# Patient Record
Sex: Female | Born: 1975
Health system: Southern US, Community
[De-identification: ages and names within clinical notes are randomized; demographics above are authoritative.]

## PROBLEM LIST (undated history)

## (undated) DIAGNOSIS — I499 Cardiac arrhythmia, unspecified: Secondary | ICD-10-CM

## (undated) DIAGNOSIS — Z8619 Personal history of other infectious and parasitic diseases: Secondary | ICD-10-CM

## (undated) DIAGNOSIS — N809 Endometriosis, unspecified: Secondary | ICD-10-CM

## (undated) DIAGNOSIS — R011 Cardiac murmur, unspecified: Secondary | ICD-10-CM

## (undated) DIAGNOSIS — I10 Essential (primary) hypertension: Secondary | ICD-10-CM

## (undated) DIAGNOSIS — Z87440 Personal history of urinary (tract) infections: Secondary | ICD-10-CM

## (undated) HISTORY — DX: Personal history of other infectious and parasitic diseases: Z86.19

## (undated) HISTORY — PX: OVARIAN CYST REMOVAL: SHX89

## (undated) HISTORY — DX: Endometriosis, unspecified: N80.9

## (undated) HISTORY — DX: Personal history of urinary (tract) infections: Z87.440

## (undated) HISTORY — DX: Cardiac arrhythmia, unspecified: I49.9

## (undated) HISTORY — PX: TUBAL LIGATION: SHX77

## (undated) HISTORY — DX: Cardiac murmur, unspecified: R01.1

## (undated) HISTORY — DX: Essential (primary) hypertension: I10

---

## 2012-03-30 HISTORY — PX: OOPHORECTOMY: SHX86

## 2012-08-08 LAB — HM PAP SMEAR: HM Pap smear: NORMAL

## 2012-12-15 DIAGNOSIS — I10 Essential (primary) hypertension: Secondary | ICD-10-CM | POA: Insufficient documentation

## 2013-08-20 DIAGNOSIS — R002 Palpitations: Secondary | ICD-10-CM | POA: Insufficient documentation

## 2014-01-05 ENCOUNTER — Ambulatory Visit: Payer: Self-pay | Admitting: Family Medicine

## 2014-01-05 LAB — URINALYSIS, COMPLETE
BILIRUBIN, UR: NEGATIVE
Glucose,UR: NEGATIVE
Ketone: NEGATIVE
NITRITE: NEGATIVE
Ph: 7 (ref 5.0–8.0)
Protein: NEGATIVE
Specific Gravity: 1.01 (ref 1.000–1.030)

## 2014-01-07 LAB — URINE CULTURE

## 2014-02-08 ENCOUNTER — Encounter: Payer: Self-pay | Admitting: Internal Medicine

## 2014-02-08 ENCOUNTER — Other Ambulatory Visit (HOSPITAL_COMMUNITY)
Admission: RE | Admit: 2014-02-08 | Discharge: 2014-02-08 | Disposition: A | Discharge status: Home / Self Care | Payer: 59 | Source: Ambulatory Visit | Attending: Internal Medicine | Admitting: Internal Medicine

## 2014-02-08 ENCOUNTER — Ambulatory Visit (INDEPENDENT_AMBULATORY_CARE_PROVIDER_SITE_OTHER): Payer: 59 | Admitting: Internal Medicine

## 2014-02-08 VITALS — BP 138/82 | HR 83 | Temp 98.5°F | Wt 200.2 lb

## 2014-02-08 DIAGNOSIS — Z1151 Encounter for screening for human papillomavirus (HPV): Secondary | ICD-10-CM | POA: Diagnosis present

## 2014-02-08 DIAGNOSIS — I10 Essential (primary) hypertension: Secondary | ICD-10-CM

## 2014-02-08 DIAGNOSIS — Z01419 Encounter for gynecological examination (general) (routine) without abnormal findings: Secondary | ICD-10-CM | POA: Diagnosis present

## 2014-02-08 DIAGNOSIS — Z0000 Encounter for general adult medical examination without abnormal findings: Secondary | ICD-10-CM | POA: Insufficient documentation

## 2014-02-08 LAB — HM PAP SMEAR: HM Pap smear: NEGATIVE

## 2014-02-08 MED ORDER — ESOMEPRAZOLE MAGNESIUM 40 MG PO CPDR: 40.0000 mg | DELAYED_RELEASE_CAPSULE | Freq: Every day | ORAL | Status: DC

## 2014-02-08 MED ORDER — OLMESARTAN MEDOXOMIL 20 MG PO TABS: 20.0000 mg | ORAL_TABLET | Freq: Every day | ORAL | Status: DC

## 2014-02-08 MED ORDER — METOPROLOL SUCCINATE ER 25 MG PO TB24: 25.0000 mg | ORAL_TABLET | Freq: Every day | ORAL | Status: DC

## 2014-02-08 NOTE — Progress Notes (Signed)
Pre visit review using our clinic review tool, if applicable. No additional management support is needed unless otherwise documented below in the visit note. 

## 2014-02-08 NOTE — Assessment & Plan Note (Addendum)
BP Readings from Last 3 Encounters:  02/08/14 138/82   BP well controlled. Will check renal function with labs. Reviewed notes from [facility]. Continue Benicar and Metoprolol.

## 2014-02-08 NOTE — Addendum Note (Signed)
Addended by: Karlene Einstein D on: 02/08/2014 04:50 PM   Modules accepted: Orders

## 2014-02-08 NOTE — Patient Instructions (Signed)

## 2014-02-08 NOTE — Progress Notes (Signed)
Subjective:    Patient ID: Amanda Acosta, female    DOB: 01-Dec-1975, 38 y.o.   MRN: [id number]  HPI 38YO female presents to establish care and for physical exam.  Feeling well.  Followed at [facility] cardiology for hypertension and palpitations. Tolerating medications well. BP mostly <130/80 in the morning, but occasionally slightly higher in the evenings. No chest pain, headache, palpitations.  Last PAP 18 months ago was normal. Previous PAP HPV pos.   Review of Systems  Constitutional: Negative for fever, chills, appetite change, fatigue and unexpected weight change.  Eyes: Negative for visual disturbance.  Respiratory: Negative for shortness of breath.   Cardiovascular: Negative for chest pain and leg swelling.  Gastrointestinal: Negative for nausea, vomiting, abdominal pain, diarrhea and constipation.  Musculoskeletal: Negative for myalgias and arthralgias.  Skin: Negative for color change and rash.  Hematological: Negative for adenopathy. Does not bruise/bleed easily.  Psychiatric/Behavioral: Negative for dysphoric mood. The patient is not nervous/anxious.        Objective:    BP 138/82 mmHg  Pulse 83  Temp(Src) 98.5 F (36.9 C) (Oral)  Wt 200 lb 4 oz (90.833 kg)  SpO2 99%  LMP 01/27/2014 Physical Exam  Constitutional: She is oriented to person, place, and time. She appears well-developed and well-nourished. No distress.  HENT:  Head: Normocephalic and atraumatic.  Right Ear: External ear normal.  Left Ear: External ear normal.  Nose: Nose normal.  Mouth/Throat: Oropharynx is clear and moist. No oropharyngeal exudate.  Eyes: Conjunctivae are normal. Pupils are equal, round, and reactive to light. Right eye exhibits no discharge. Left eye exhibits no discharge. No scleral icterus.  Neck: Normal range of motion. Neck supple. No tracheal deviation present. No thyromegaly present.  Cardiovascular: Normal rate, regular rhythm, normal heart sounds and intact distal pulses.   Exam reveals no gallop and no friction rub.   No murmur heard. Pulmonary/Chest: Effort normal and breath sounds normal. No respiratory distress. She has no wheezes. She has no rales. She exhibits no tenderness.  Abdominal: Soft. Bowel sounds are normal. She exhibits no distension and no mass. There is no tenderness. There is no rebound and no guarding.  Genitourinary: Rectum normal, vagina normal and uterus normal. No breast swelling, tenderness, discharge or bleeding. Pelvic exam was performed with patient supine. There is no rash, tenderness or lesion on the right labia. There is no rash, tenderness or lesion on the left labia. Uterus is not enlarged and not tender. Cervix exhibits no motion tenderness, no discharge and no friability. Right adnexum displays no mass, no tenderness and no fullness. Left adnexum displays no mass, no tenderness and no fullness. No erythema or tenderness in the vagina. No vaginal discharge found.  Musculoskeletal: Normal range of motion. She exhibits no edema or tenderness.  Lymphadenopathy:    She has no cervical adenopathy.  Neurological: She is alert and oriented to person, place, and time. No cranial nerve deficit. She exhibits normal muscle tone. Coordination normal.  Skin: Skin is warm and dry. No rash noted. She is not diaphoretic. No erythema. No pallor.  Psychiatric: She has a normal mood and affect. Her behavior is normal. Judgment and thought content normal.          Assessment & Plan:   Problem List Items Addressed This Visit      Unprioritized   BP (high blood pressure)    BP Readings from Last 3 Encounters:  02/08/14 138/82   BP well controlled. Will check renal function with  labs. Reviewed notes from [facility]. Continue Benicar and Metoprolol.    Relevant Medications      metoprolol succinate (TOPROL-XL) 24 hr tablet      olmesartan (BENICAR) tablet   Routine general medical examination at a health care facility - Primary    General medical  exam normal today including breast and pelvic exam. PAP pending. Encouraged healthy diet and exercise. Will get fasting labs including CBC, CMP, lipids, TSH, A1c. Flu vaccine UTD.    Relevant Orders      CBC with Differential      Comprehensive metabolic panel      Lipid panel      Microalbumin / creatinine urine ratio      Vit D  25 hydroxy (rtn osteoporosis monitoring)      TSH      Hemoglobin A1c       Return in about 1 year (around 02/09/2015) for Physical.

## 2014-02-08 NOTE — Assessment & Plan Note (Signed)
General medical exam normal today including breast and pelvic exam. PAP pending. Encouraged healthy diet and exercise. Will get fasting labs including CBC, CMP, lipids, TSH, A1c. Flu vaccine UTD.

## 2014-02-14 ENCOUNTER — Telehealth: Payer: Self-pay | Admitting: Internal Medicine

## 2014-02-14 NOTE — Telephone Encounter (Signed)
emmi emailed °

## 2014-02-15 ENCOUNTER — Other Ambulatory Visit: Payer: Self-pay | Admitting: *Deleted

## 2014-02-15 ENCOUNTER — Encounter: Payer: Self-pay | Admitting: Internal Medicine

## 2014-02-15 DIAGNOSIS — Z0000 Encounter for general adult medical examination without abnormal findings: Secondary | ICD-10-CM

## 2014-02-15 LAB — CYTOLOGY - PAP

## 2014-02-16 ENCOUNTER — Other Ambulatory Visit (INDEPENDENT_AMBULATORY_CARE_PROVIDER_SITE_OTHER): Payer: 59

## 2014-02-16 DIAGNOSIS — Z0000 Encounter for general adult medical examination without abnormal findings: Secondary | ICD-10-CM

## 2014-02-16 LAB — LIPID PANEL
CHOL/HDL RATIO: 3
Cholesterol: 173 mg/dL (ref 0–200)
HDL: 52.9 mg/dL (ref >39.00)
LDL Cholesterol: 110 mg/dL — ABNORMAL HIGH (ref 0–99)
NonHDL: 120.1
Triglycerides: 53 mg/dL (ref 0.0–149.0)
VLDL: 10.6 mg/dL (ref 0.0–40.0)

## 2014-02-16 LAB — COMPREHENSIVE METABOLIC PANEL
ALT: 21 U/L (ref 0–35)
AST: 17 U/L (ref 0–37)
Albumin: 4.2 g/dL (ref 3.5–5.2)
Alkaline Phosphatase: 48 U/L (ref 39–117)
BILIRUBIN TOTAL: 0.8 mg/dL (ref 0.2–1.2)
BUN: 13 mg/dL (ref 6–23)
CO2: 25 mEq/L (ref 19–32)
CREATININE: 0.8 mg/dL (ref 0.4–1.2)
Calcium: 9.2 mg/dL (ref 8.4–10.5)
Chloride: 106 mEq/L (ref 96–112)
GFR: 87.14 mL/min (ref >60.00)
Glucose, Bld: 97 mg/dL (ref 70–99)
Potassium: 4.9 mEq/L (ref 3.5–5.1)
Sodium: 137 mEq/L (ref 135–145)
Total Protein: 6.7 g/dL (ref 6.0–8.3)

## 2014-02-16 LAB — CBC WITH DIFFERENTIAL/PLATELET
BASOS ABS: 0 10*3/uL (ref 0.0–0.1)
Basophils Relative: 0.3 % (ref 0.0–3.0)
EOS ABS: 0.1 10*3/uL (ref 0.0–0.7)
Eosinophils Relative: 1.2 % (ref 0.0–5.0)
HEMATOCRIT: 33.8 % — AB (ref 36.0–46.0)
HEMOGLOBIN: 11.2 g/dL — AB (ref 12.0–15.0)
LYMPHS ABS: 1.2 10*3/uL (ref 0.7–4.0)
LYMPHS PCT: 14.9 % (ref 12.0–46.0)
MCHC: 33.2 g/dL (ref 30.0–36.0)
MCV: 87.9 fl (ref 78.0–100.0)
Monocytes Absolute: 0.7 10*3/uL (ref 0.1–1.0)
Monocytes Relative: 8.3 % (ref 3.0–12.0)
Neutro Abs: 6 10*3/uL (ref 1.4–7.7)
Neutrophils Relative %: 75.3 % (ref 43.0–77.0)
Platelets: 250 10*3/uL (ref 150.0–400.0)
RBC: 3.84 Mil/uL — ABNORMAL LOW (ref 3.87–5.11)
RDW: 14.1 % (ref 11.5–15.5)
WBC: 7.9 10*3/uL (ref 4.0–10.5)

## 2014-02-16 LAB — TSH: TSH: 1.29 u[IU]/mL (ref 0.35–4.50)

## 2014-02-16 LAB — MAGNESIUM: MAGNESIUM: 1.8 mg/dL (ref 1.5–2.5)

## 2014-02-16 LAB — HEMOGLOBIN A1C: Hgb A1c MFr Bld: 6 % (ref 4.6–6.5)

## 2014-02-16 LAB — VITAMIN D 25 HYDROXY (VIT D DEFICIENCY, FRACTURES): VITD: 30.42 ng/mL (ref 30.00–100.00)

## 2014-02-16 LAB — MICROALBUMIN / CREATININE URINE RATIO
Creatinine,U: 49.2 mg/dL
Microalb Creat Ratio: 1.4 mg/g (ref 0.0–30.0)
Microalb, Ur: 0.7 mg/dL (ref 0.0–1.9)

## 2014-05-26 ENCOUNTER — Ambulatory Visit: Payer: Self-pay | Admitting: Family Medicine

## 2014-09-17 ENCOUNTER — Encounter: Payer: Self-pay | Admitting: Internal Medicine

## 2014-09-17 MED ORDER — VALSARTAN 40 MG PO TABS: 40.0000 mg | ORAL_TABLET | Freq: Every day | ORAL | Status: DC

## 2014-09-17 NOTE — Telephone Encounter (Signed)
Changed Benicar to Valsartan per her insurance request.  Needs follow up visit to check BMP and BP nurse visit next week. Pt MUST be using birth control while on Valsartan.

## 2014-09-18 ENCOUNTER — Telehealth: Payer: Self-pay | Admitting: Internal Medicine

## 2014-09-18 NOTE — Telephone Encounter (Signed)
Pt called to schedule lab visit since there has been a change in medication. No labs ordered. Please advise/msn

## 2014-09-19 NOTE — Telephone Encounter (Signed)
Per Dr Derry Skill MyChart message to the pt, Pt needs a follow up visit, not just labs.  Labs will be ordered during OV.  Notified pt by MyChart.

## 2014-10-05 ENCOUNTER — Encounter: Payer: Self-pay | Admitting: Nurse Practitioner

## 2014-10-05 ENCOUNTER — Ambulatory Visit (INDEPENDENT_AMBULATORY_CARE_PROVIDER_SITE_OTHER): Payer: BC Managed Care – PPO | Admitting: Nurse Practitioner

## 2014-10-05 VITALS — BP 142/88 | HR 72 | Temp 98.3°F | Resp 16 | Ht 68.25 in | Wt 205.8 lb

## 2014-10-05 DIAGNOSIS — D500 Iron deficiency anemia secondary to blood loss (chronic): Secondary | ICD-10-CM

## 2014-10-05 DIAGNOSIS — I10 Essential (primary) hypertension: Secondary | ICD-10-CM | POA: Diagnosis not present

## 2014-10-05 LAB — CBC WITH DIFFERENTIAL/PLATELET
BASOS PCT: 0.4 % (ref 0.0–3.0)
Basophils Absolute: 0 10*3/uL (ref 0.0–0.1)
EOS PCT: 2.7 % (ref 0.0–5.0)
Eosinophils Absolute: 0.1 10*3/uL (ref 0.0–0.7)
HCT: 35 % — ABNORMAL LOW (ref 36.0–46.0)
Hemoglobin: 11.6 g/dL — ABNORMAL LOW (ref 12.0–15.0)
Lymphocytes Relative: 24.8 % (ref 12.0–46.0)
Lymphs Abs: 1.3 10*3/uL (ref 0.7–4.0)
MCHC: 33.2 g/dL (ref 30.0–36.0)
MCV: 90.8 fl (ref 78.0–100.0)
Monocytes Absolute: 0.5 10*3/uL (ref 0.1–1.0)
Monocytes Relative: 8.6 % (ref 3.0–12.0)
Neutro Abs: 3.4 10*3/uL (ref 1.4–7.7)
Neutrophils Relative %: 63.5 % (ref 43.0–77.0)
PLATELETS: 244 10*3/uL (ref 150.0–400.0)
RBC: 3.85 Mil/uL — ABNORMAL LOW (ref 3.87–5.11)
RDW: 14.1 % (ref 11.5–15.5)
WBC: 5.4 10*3/uL (ref 4.0–10.5)

## 2014-10-05 LAB — BASIC METABOLIC PANEL
BUN: 14 mg/dL (ref 6–23)
CALCIUM: 9.5 mg/dL (ref 8.4–10.5)
CHLORIDE: 105 meq/L (ref 96–112)
CO2: 29 meq/L (ref 19–32)
CREATININE: 0.74 mg/dL (ref 0.40–1.20)
GFR: 92.3 mL/min (ref >60.00)
Glucose, Bld: 83 mg/dL (ref 70–99)
POTASSIUM: 4.7 meq/L (ref 3.5–5.1)
Sodium: 139 mEq/L (ref 135–145)

## 2014-10-05 MED ORDER — VALSARTAN 40 MG PO TABS: 40.0000 mg | ORAL_TABLET | Freq: Every day | ORAL | Status: DC

## 2014-10-05 NOTE — Assessment & Plan Note (Signed)
Stable. Occasional elevations still present. Pt had daughter with her and was active. Will obtain BMET. Pt reports being stable on her Valsartan and no complaints or need for refills. FU in 6 months with Dr. Gilford Rile. Encouraged her to call if consistently above 140/90 either number or both.

## 2014-10-05 NOTE — Assessment & Plan Note (Signed)
Obtain CBC w/ diff today. Pt reports heavy periods and last CBC in 01/2015 shows anemia.

## 2014-10-05 NOTE — Progress Notes (Signed)
   Subjective:    Patient ID: Nelly Rout, female    DOB: 09-Aug-1975, 39 y.o.   MRN: [id number]  HPI  Ms. Laursen is a 39 yo female following up on her HTN.   1) 120's/90's recently, watching salt intake. Pt was with daughter today and active (feels this is why it is slightly elevated). No concerns today or need for refills.   BP Readings from Last 3 Encounters:  10/05/14 142/88  02/08/14 138/82   2) Looking back at labs- pt reports heavy periods   Review of Systems  Constitutional: Negative for fever, chills, diaphoresis and fatigue.  Respiratory: Negative for chest tightness, shortness of breath and wheezing.   Cardiovascular: Negative for chest pain, palpitations and leg swelling.  Gastrointestinal: Negative for nausea, vomiting and diarrhea.  Skin: Negative for rash.  Neurological: Negative for dizziness, weakness, numbness and headaches.  Psychiatric/Behavioral: The patient is not nervous/anxious.        Objective:   Physical Exam  Constitutional: She is oriented to person, place, and time. She appears well-developed and well-nourished. No distress.  BP 142/88 mmHg  Pulse 72  Temp(Src) 98.3 F (36.8 C)  Resp 16  Ht 5' 8.25" (1.734 m)  Wt 205 lb 12.8 oz (93.35 kg)  BMI 31.05 kg/m2  SpO2 99%   HENT:  Head: Normocephalic and atraumatic.  Right Ear: External ear normal.  Left Ear: External ear normal.  Eyes: EOM are normal. Pupils are equal, round, and reactive to light.  Cardiovascular: Normal rate, regular rhythm and normal heart sounds.   Pulmonary/Chest: Effort normal and breath sounds normal. No respiratory distress. She has no wheezes. She has no rales. She exhibits no tenderness.  Neurological: She is alert and oriented to person, place, and time.  Skin: She is not diaphoretic.  Psychiatric: She has a normal mood and affect. Her behavior is normal. Judgment and thought content normal.       Assessment & Plan:

## 2014-10-05 NOTE — Patient Instructions (Signed)
Follow up in 6 months with Dr. Gilford Rile.

## 2014-10-05 NOTE — Progress Notes (Signed)
Pre visit review using our clinic review tool, if applicable. No additional management support is needed unless otherwise documented below in the visit note. 

## 2014-11-14 ENCOUNTER — Telehealth: Payer: Self-pay | Admitting: Internal Medicine

## 2014-11-14 ENCOUNTER — Encounter: Payer: Self-pay | Admitting: Internal Medicine

## 2014-11-14 NOTE — Telephone Encounter (Signed)
°  ° °   pt wants to know esomeprazol

## 2015-03-04 ENCOUNTER — Other Ambulatory Visit: Payer: Self-pay | Admitting: Internal Medicine

## 2015-04-17 ENCOUNTER — Telehealth: Payer: Self-pay

## 2015-04-17 MED ORDER — VALSARTAN 40 MG PO TABS: 40.0000 mg | ORAL_TABLET | Freq: Every day | ORAL | Status: DC

## 2015-04-17 NOTE — Telephone Encounter (Signed)
Pt called and asked for refill on BP med. Med was refilled until appointment with Dr.Walker./tvw

## 2015-05-06 MED FILL — VALSARTAN 40 MG TABLET: 40 | 30 days supply | Qty: 30 | Fill #0

## 2015-05-09 ENCOUNTER — Ambulatory Visit (INDEPENDENT_AMBULATORY_CARE_PROVIDER_SITE_OTHER): Payer: BC Managed Care – PPO | Admitting: Internal Medicine

## 2015-05-09 ENCOUNTER — Encounter: Payer: Self-pay | Admitting: Internal Medicine

## 2015-05-09 ENCOUNTER — Other Ambulatory Visit
Admission: RE | Admit: 2015-05-09 | Discharge: 2015-05-09 | Disposition: A | Discharge status: Home / Self Care | Payer: BC Managed Care – PPO | Source: Ambulatory Visit | Attending: Internal Medicine | Admitting: Internal Medicine

## 2015-05-09 VITALS — BP 120/79 | HR 83 | Temp 98.5°F | Ht 67.75 in | Wt 213.4 lb

## 2015-05-09 DIAGNOSIS — Z0000 Encounter for general adult medical examination without abnormal findings: Secondary | ICD-10-CM | POA: Insufficient documentation

## 2015-05-09 LAB — COMPREHENSIVE METABOLIC PANEL
ALK PHOS: 47 U/L (ref 38–126)
ALT: 28 U/L (ref 14–54)
AST: 23 U/L (ref 15–41)
Albumin: 4.3 g/dL (ref 3.5–5.0)
Anion gap: 3 — ABNORMAL LOW (ref 5–15)
BUN: 11 mg/dL (ref 6–20)
CALCIUM: 9.4 mg/dL (ref 8.9–10.3)
CO2: 26 mmol/L (ref 22–32)
CREATININE: 0.78 mg/dL (ref 0.44–1.00)
Chloride: 110 mmol/L (ref 101–111)
Glucose, Bld: 97 mg/dL (ref 65–99)
Potassium: 4.7 mmol/L (ref 3.5–5.1)
SODIUM: 139 mmol/L (ref 135–145)
Total Bilirubin: 0.7 mg/dL (ref 0.3–1.2)
Total Protein: 7.3 g/dL (ref 6.5–8.1)

## 2015-05-09 LAB — CBC WITH DIFFERENTIAL/PLATELET
BASOS PCT: 0 %
Basophils Absolute: 0 10*3/uL (ref 0–0.1)
EOS ABS: 0.1 10*3/uL (ref 0–0.7)
EOS PCT: 2 %
HCT: 36.6 % (ref 35.0–47.0)
HEMOGLOBIN: 11.9 g/dL — AB (ref 12.0–16.0)
LYMPHS ABS: 1.3 10*3/uL (ref 1.0–3.6)
Lymphocytes Relative: 28 %
MCH: 28.5 pg (ref 26.0–34.0)
MCHC: 32.6 g/dL (ref 32.0–36.0)
MCV: 87.5 fL (ref 80.0–100.0)
MONOS PCT: 10 %
Monocytes Absolute: 0.5 10*3/uL (ref 0.2–0.9)
NEUTROS PCT: 60 %
Neutro Abs: 2.9 10*3/uL (ref 1.4–6.5)
PLATELETS: 246 10*3/uL (ref 150–440)
RBC: 4.18 MIL/uL (ref 3.80–5.20)
RDW: 14.6 % — AB (ref 11.5–14.5)
WBC: 4.9 10*3/uL (ref 3.6–11.0)

## 2015-05-09 LAB — LIPID PANEL
Cholesterol: 191 mg/dL (ref 0–200)
HDL: 61 mg/dL (ref >40)
LDL Cholesterol: 116 mg/dL — ABNORMAL HIGH (ref 0–99)
TRIGLYCERIDES: 70 mg/dL (ref <150)
Total CHOL/HDL Ratio: 3.1 RATIO
VLDL: 14 mg/dL (ref 0–40)

## 2015-05-09 LAB — TSH: TSH: 1.788 u[IU]/mL (ref 0.350–4.500)

## 2015-05-09 MED ORDER — METOPROLOL SUCCINATE ER 25 MG PO TB24: 25.0000 mg | ORAL_TABLET | Freq: Every day | ORAL | Status: DC

## 2015-05-09 MED ORDER — VALSARTAN 40 MG PO TABS: 40.0000 mg | ORAL_TABLET | Freq: Every day | ORAL | Status: DC

## 2015-05-09 NOTE — Progress Notes (Signed)
Pre visit review using our clinic review tool, if applicable. No additional management support is needed unless otherwise documented below in the visit note. 

## 2015-05-09 NOTE — Progress Notes (Signed)
Subjective:    Patient ID: Amanda Acosta, female    DOB: [birth year], 40 y.o.   MRN: [id number]  HPI  40YO female presents for annual exam. Last seen in 2015.  Periods have become more irregular. Sometimes skips months. Hair seems thinner. No hot flashes or vaginal dryness.    Wt Readings from Last 3 Encounters:  05/09/15 213 lb 6 oz (96.786 kg)  10/05/14 205 lb 12.8 oz (93.35 kg)  02/08/14 200 lb 4 oz (90.833 kg)   BP Readings from Last 3 Encounters:  05/09/15 120/79  10/05/14 142/88  02/08/14 138/82    Past Medical History  Diagnosis Date  . History of genital warts   . Arrhythmia   . Heart murmur   . History of urinary tract infection   . Hypertension     started with pregnancy, evaluated by cardiology  . Endometriosis    Family History  Problem Relation Age of Onset  . Heart disease Paternal Grandmother   . Hypertension Paternal Grandmother   . Heart disease Paternal Grandfather   . Hypertension Paternal Grandfather   . Hypertension Father   . Cancer Father 33    NHL   Past Surgical History  Procedure Laterality Date  . Tubal ligation    . Ovarian cyst removal    . Oophorectomy  2014    right, [facility]  . Vaginal delivery      2   Social History   Social History  . Marital Status: Married    Spouse Name: N/A  . Number of Children: N/A  . Years of Education: N/A   Social History Main Topics  . Smoking status: Never Smoker   . Smokeless tobacco: None  . Alcohol Use: No  . Drug Use: No  . Sexual Activity: Not Asked   Other Topics Concern  . None   Social History Narrative   Lives in [place] with husband and children. 1 dog      Work - [employer], Press photographer, works 2 days per week.      Hobbies - kids      Diet - healthy diet      Exercise - 3 days per week at [facility]          Review of Systems  Constitutional: Negative for fever, chills, appetite change, fatigue and unexpected weight change.  Eyes: Negative for visual  disturbance.  Respiratory: Negative for shortness of breath.   Cardiovascular: Negative for chest pain and leg swelling.  Gastrointestinal: Negative for nausea, vomiting, abdominal pain, diarrhea and constipation.  Genitourinary: Positive for menstrual problem. Negative for pelvic pain.  Musculoskeletal: Negative for myalgias and arthralgias.  Skin: Negative for color change and rash.  Hematological: Negative for adenopathy. Does not bruise/bleed easily.  Psychiatric/Behavioral: Negative for suicidal ideas, sleep disturbance and dysphoric mood. The patient is not nervous/anxious.        Objective:    BP 120/79 mmHg  Pulse 83  Temp(Src) 98.5 F (36.9 C) (Oral)  Ht 5' 7.75" (1.721 m)  Wt 213 lb 6 oz (96.786 kg)  BMI 32.68 kg/m2  SpO2 100%  LMP 03/31/2015 Physical Exam  Constitutional: She is oriented to person, place, and time. She appears well-developed and well-nourished. No distress.  HENT:  Head: Normocephalic and atraumatic.  Right Ear: External ear normal.  Left Ear: External ear normal.  Nose: Nose normal.  Mouth/Throat: Oropharynx is clear and moist. No oropharyngeal exudate.  Eyes: Conjunctivae are normal. Pupils are equal, round,  and reactive to light. Right eye exhibits no discharge. Left eye exhibits no discharge. No scleral icterus.  Neck: Normal range of motion. Neck supple. No tracheal deviation present. No thyromegaly present.  Cardiovascular: Normal rate, regular rhythm, normal heart sounds and intact distal pulses.  Exam reveals no gallop and no friction rub.   No murmur heard. Pulmonary/Chest: Effort normal and breath sounds normal. No accessory muscle usage. No tachypnea. No respiratory distress. She has no decreased breath sounds. She has no wheezes. She has no rales. She exhibits no tenderness. Right breast exhibits no inverted nipple, no mass, no nipple discharge, no skin change and no tenderness. Left breast exhibits no inverted nipple, no mass, no nipple  discharge, no skin change and no tenderness. Breasts are symmetrical.  Abdominal: Soft. Bowel sounds are normal. She exhibits no distension and no mass. There is no tenderness. There is no rebound and no guarding.  Musculoskeletal: Normal range of motion. She exhibits no edema or tenderness.  Lymphadenopathy:    She has no cervical adenopathy.  Neurological: She is alert and oriented to person, place, and time. No cranial nerve deficit. She exhibits normal muscle tone. Coordination normal.  Skin: Skin is warm and dry. No rash noted. She is not diaphoretic. No erythema. No pallor.  Psychiatric: She has a normal mood and affect. Her behavior is normal. Judgment and thought content normal.          Assessment & Plan:   Problem List Items Addressed This Visit      Unprioritized   Routine general medical examination at a health care facility - Primary    General medical exam including breast exam normal. PAP and pelvic deferred as PAP normal in 2015, plan repeat in 2020. Mammogram ordered. Immunizations UTD. Labs ordered. Encouraged healthy diet and exercise.      Relevant Orders   TSH   CBC with Differential/Platelet   Comprehensive metabolic panel   Lipid panel   VITAMIN D 25 Hydroxy (Vit-D Deficiency, Fractures)   FSH   LH   MM Digital Screening       Return in about 6 months (around 11/06/2015) for Recheck.

## 2015-05-09 NOTE — Patient Instructions (Signed)
Health Maintenance, Female Adopting a healthy lifestyle and getting preventive care can go a long way to promote health and wellness. Talk with your health care provider about what schedule of regular examinations is right for you. This is a good chance for you to check in with your provider about disease prevention and staying healthy. In between checkups, there are plenty of things you can do on your own. Experts have done a lot of research about which lifestyle changes and preventive measures are most likely to keep you healthy. Ask your health care provider for more information. WEIGHT AND DIET  Eat a healthy diet  Be sure to include plenty of vegetables, fruits, low-fat dairy products, and lean protein.  Do not eat a lot of foods high in solid fats, added sugars, or salt.  Get regular exercise. This is one of the most important things you can do for your health.  Most adults should exercise for at least 150 minutes each week. The exercise should increase your heart rate and make you sweat (moderate-intensity exercise).  Most adults should also do strengthening exercises at least twice a week. This is in addition to the moderate-intensity exercise.  Maintain a healthy weight  Body mass index (BMI) is a measurement that can be used to identify possible weight problems. It estimates body fat based on height and weight. Your health care provider can help determine your BMI and help you achieve or maintain a healthy weight.  For females 20 years of age and older:   A BMI below 18.5 is considered underweight.  A BMI of 18.5 to 24.9 is normal.  A BMI of 25 to 29.9 is considered overweight.  A BMI of 30 and above is considered obese.  Watch levels of cholesterol and blood lipids  You should start having your blood tested for lipids and cholesterol at 41 years of age, then have this test every 5 years.  You may need to have your cholesterol levels checked more often if:  Your lipid  or cholesterol levels are high.  You are older than 41 years of age.  You are at high risk for heart disease.  CANCER SCREENING   Lung Cancer  Lung cancer screening is recommended for adults 55-80 years old who are at high risk for lung cancer because of a history of smoking.  A yearly low-dose CT scan of the lungs is recommended for people who:  Currently smoke.  Have quit within the past 15 years.  Have at least a 30-pack-year history of smoking. A pack year is smoking an average of one pack of cigarettes a day for 1 year.  Yearly screening should continue until it has been 15 years since you quit.  Yearly screening should stop if you develop a health problem that would prevent you from having lung cancer treatment.  Breast Cancer  Practice breast self-awareness. This means understanding how your breasts normally appear and feel.  It also means doing regular breast self-exams. Let your health care provider know about any changes, no matter how small.  If you are in your 20s or 30s, you should have a clinical breast exam (CBE) by a health care provider every 1-3 years as part of a regular health exam.  If you are 40 or older, have a CBE every year. Also consider having a breast X-ray (mammogram) every year.  If you have a family history of breast cancer, talk to your health care provider about genetic screening.  If you   are at high risk for breast cancer, talk to your health care provider about having an MRI and a mammogram every year.  Breast cancer gene (BRCA) assessment is recommended for women who have family members with BRCA-related cancers. BRCA-related cancers include:  Breast.  Ovarian.  Tubal.  Peritoneal cancers.  Results of the assessment will determine the need for genetic counseling and BRCA1 and BRCA2 testing. Cervical Cancer Your health care provider may recommend that you be screened regularly for cancer of the pelvic organs (ovaries, uterus, and  vagina). This screening involves a pelvic examination, including checking for microscopic changes to the surface of your cervix (Pap test). You may be encouraged to have this screening done every 3 years, beginning at age 21.  For women ages 30-65, health care providers may recommend pelvic exams and Pap testing every 3 years, or they may recommend the Pap and pelvic exam, combined with testing for human papilloma virus (HPV), every 5 years. Some types of HPV increase your risk of cervical cancer. Testing for HPV may also be done on women of any age with unclear Pap test results.  Other health care providers may not recommend any screening for nonpregnant women who are considered low risk for pelvic cancer and who do not have symptoms. Ask your health care provider if a screening pelvic exam is right for you.  If you have had past treatment for cervical cancer or a condition that could lead to cancer, you need Pap tests and screening for cancer for at least 20 years after your treatment. If Pap tests have been discontinued, your risk factors (such as having a new sexual partner) need to be reassessed to determine if screening should resume. Some women have medical problems that increase the chance of getting cervical cancer. In these cases, your health care provider may recommend more frequent screening and Pap tests. Colorectal Cancer  This type of cancer can be detected and often prevented.  Routine colorectal cancer screening usually begins at 41 years of age and continues through 41 years of age.  Your health care provider may recommend screening at an earlier age if you have risk factors for colon cancer.  Your health care provider may also recommend using home test kits to check for hidden blood in the stool.  A small camera at the end of a tube can be used to examine your colon directly (sigmoidoscopy or colonoscopy). This is done to check for the earliest forms of colorectal  cancer.  Routine screening usually begins at age 50.  Direct examination of the colon should be repeated every 5-10 years through 41 years of age. However, you may need to be screened more often if early forms of precancerous polyps or small growths are found. Skin Cancer  Check your skin from head to toe regularly.  Tell your health care provider about any new moles or changes in moles, especially if there is a change in a mole's shape or color.  Also tell your health care provider if you have a mole that is larger than the size of a pencil eraser.  Always use sunscreen. Apply sunscreen liberally and repeatedly throughout the day.  Protect yourself by wearing long sleeves, pants, a wide-brimmed hat, and sunglasses whenever you are outside. HEART DISEASE, DIABETES, AND HIGH BLOOD PRESSURE   High blood pressure causes heart disease and increases the risk of stroke. High blood pressure is more likely to develop in:  People who have blood pressure in the high end   of the normal range (130-139/85-89 mm Hg).  People who are overweight or obese.  People who are African American.  If you are 38-23 years of age, have your blood pressure checked every 3-5 years. If you are 61 years of age or older, have your blood pressure checked every year. You should have your blood pressure measured twice--once when you are at a hospital or clinic, and once when you are not at a hospital or clinic. Record the average of the two measurements. To check your blood pressure when you are not at a hospital or clinic, you can use:  An automated blood pressure machine at a pharmacy.  A home blood pressure monitor.  If you are between 45 years and 39 years old, ask your health care provider if you should take aspirin to prevent strokes.  Have regular diabetes screenings. This involves taking a blood sample to check your fasting blood sugar level.  If you are at a normal weight and have a low risk for diabetes,  have this test once every three years after 41 years of age.  If you are overweight and have a high risk for diabetes, consider being tested at a younger age or more often. PREVENTING INFECTION  Hepatitis B  If you have a higher risk for hepatitis B, you should be screened for this virus. You are considered at high risk for hepatitis B if:  You were born in a country where hepatitis B is common. Ask your health care provider which countries are considered high risk.  Your parents were born in a high-risk country, and you have not been immunized against hepatitis B (hepatitis B vaccine).  You have HIV or AIDS.  You use needles to inject street drugs.  You live with someone who has hepatitis B.  You have had sex with someone who has hepatitis B.  You get hemodialysis treatment.  You take certain medicines for conditions, including cancer, organ transplantation, and autoimmune conditions. Hepatitis C  Blood testing is recommended for:  Everyone born from [birth year] through 1966.  Anyone with known risk factors for hepatitis C. Sexually transmitted infections (STIs)  You should be screened for sexually transmitted infections (STIs) including gonorrhea and chlamydia if:  You are sexually active and are younger than 41 years of age.  You are older than 41 years of age and your health care provider tells you that you are at risk for this type of infection.  Your sexual activity has changed since you were last screened and you are at an increased risk for chlamydia or gonorrhea. Ask your health care provider if you are at risk.  If you do not have HIV, but are at risk, it may be recommended that you take a prescription medicine daily to prevent HIV infection. This is called pre-exposure prophylaxis (PrEP). You are considered at risk if:  You are sexually active and do not regularly use condoms or know the HIV status of your partner(s).  You take drugs by injection.  You are sexually  active with a partner who has HIV. Talk with your health care provider about whether you are at high risk of being infected with HIV. If you choose to begin PrEP, you should first be tested for HIV. You should then be tested every 3 months for as long as you are taking PrEP.  PREGNANCY   If you are premenopausal and you may become pregnant, ask your health care provider about preconception counseling.  If you may  become pregnant, take 400 to 800 micrograms (mcg) of folic acid every day.  If you want to prevent pregnancy, talk to your health care provider about birth control (contraception). OSTEOPOROSIS AND MENOPAUSE   Osteoporosis is a disease in which the bones lose minerals and strength with aging. This can result in serious bone fractures. Your risk for osteoporosis can be identified using a bone density scan.  If you are 61 years of age or older, or if you are at risk for osteoporosis and fractures, ask your health care provider if you should be screened.  Ask your health care provider whether you should take a calcium or vitamin D supplement to lower your risk for osteoporosis.  Menopause may have certain physical symptoms and risks.  Hormone replacement therapy may reduce some of these symptoms and risks. Talk to your health care provider about whether hormone replacement therapy is right for you.  HOME CARE INSTRUCTIONS   Schedule regular health, dental, and eye exams.  Stay current with your immunizations.   Do not use any tobacco products including cigarettes, chewing tobacco, or electronic cigarettes.  If you are pregnant, do not drink alcohol.  If you are breastfeeding, limit how much and how often you drink alcohol.  Limit alcohol intake to no more than 1 drink per day for nonpregnant women. One drink equals 12 ounces of beer, 5 ounces of wine, or 1 ounces of hard liquor.  Do not use street drugs.  Do not share needles.  Ask your health care provider for help if  you need support or information about quitting drugs.  Tell your health care provider if you often feel depressed.  Tell your health care provider if you have ever been abused or do not feel safe at home.   This information is not intended to replace advice given to you by your health care provider. Make sure you discuss any questions you have with your health care provider.   Document Released: 09/29/2010 Document Revised: 04/06/2014 Document Reviewed: 02/15/2013 Elsevier Interactive Patient Education [employer].

## 2015-05-09 NOTE — Assessment & Plan Note (Signed)
General medical exam including breast exam normal. PAP and pelvic deferred as PAP normal in 2015, plan repeat in 2020. Mammogram ordered. Immunizations UTD. Labs ordered. Encouraged healthy diet and exercise.

## 2015-05-10 LAB — VITAMIN D 25 HYDROXY (VIT D DEFICIENCY, FRACTURES): VIT D 25 HYDROXY: 26.2 ng/mL — AB (ref 30.0–100.0)

## 2015-05-10 LAB — FSH/LH
FSH: 28.1 m[IU]/mL
LH: 31 m[IU]/mL

## 2015-06-06 ENCOUNTER — Ambulatory Visit: Payer: BC Managed Care – PPO

## 2015-06-06 MED FILL — VALSARTAN 40 MG TABLET: 40 | 90 days supply | Qty: 90 | Fill #0

## 2015-06-06 MED FILL — METOPROLOL SUCC ER 25 MG TA: 25 | 90 days supply | Qty: 90 | Fill #1

## 2015-07-04 ENCOUNTER — Ambulatory Visit: Payer: BC Managed Care – PPO

## 2015-08-30 MED FILL — METOPROLOL SUCC ER 25 MG TA: 25 | 90 days supply | Qty: 90 | Fill #2

## 2015-08-30 MED FILL — VALSARTAN 40 MG TABLET: 40 | 90 days supply | Qty: 90 | Fill #1

## 2015-11-28 MED FILL — METOPROLOL SUCC ER 25 MG TA: 25 | 90 days supply | Qty: 90 | Fill #3

## 2015-12-05 MED FILL — VALSARTAN 40 MG TABLET: 40 | 90 days supply | Qty: 90 | Fill #2

## 2016-02-24 MED FILL — METOPROLOL SUCC ER 25 MG TA: 25 | 90 days supply | Qty: 90 | Fill #0

## 2016-02-24 MED FILL — VALSARTAN 40 MG TABLET: 40 | 90 days supply | Qty: 90 | Fill #3

## 2016-05-08 MED FILL — METOPROLOL SUCC ER 25 MG TA: 25 | 90 days supply | Qty: 90 | Fill #1

## 2016-05-27 ENCOUNTER — Telehealth: Payer: Self-pay | Admitting: *Deleted

## 2016-05-27 DIAGNOSIS — I10 Essential (primary) hypertension: Secondary | ICD-10-CM

## 2016-05-27 NOTE — Telephone Encounter (Signed)
Pt requested a medication refill for valsartan, pt last seen by Dr Gilford Rile 05/09/15 [facility]

## 2016-05-28 MED ORDER — VALSARTAN 40 MG PO TABS: 40.0000 mg | ORAL_TABLET | Freq: Every day | ORAL | 0 refills | Status: DC

## 2016-05-28 MED FILL — VALSARTAN 40 MG TABLET: 40 | 30 days supply | Qty: 30 | Fill #0

## 2016-05-28 NOTE — Telephone Encounter (Signed)
Notify patient that a refill for valsartan was sent to her pharmacy for a one month supply. We look forward to meeting her on 06/16/16 as a new patient.

## 2016-05-29 NOTE — Telephone Encounter (Signed)
Message left for patient to return my call.  

## 2016-05-29 NOTE — Telephone Encounter (Signed)
Patient returned Chan's call.  Patient notified rx is at the pharmacy.

## 2016-06-16 ENCOUNTER — Encounter: Payer: Self-pay | Admitting: Primary Care

## 2016-06-16 ENCOUNTER — Ambulatory Visit (INDEPENDENT_AMBULATORY_CARE_PROVIDER_SITE_OTHER): Payer: BC Managed Care – PPO | Admitting: Primary Care

## 2016-06-16 VITALS — BP 122/84 | HR 89 | Temp 98.9°F | Ht 67.5 in | Wt 207.8 lb

## 2016-06-16 DIAGNOSIS — K219 Gastro-esophageal reflux disease without esophagitis: Secondary | ICD-10-CM | POA: Diagnosis not present

## 2016-06-16 DIAGNOSIS — E559 Vitamin D deficiency, unspecified: Secondary | ICD-10-CM

## 2016-06-16 DIAGNOSIS — I10 Essential (primary) hypertension: Secondary | ICD-10-CM | POA: Diagnosis not present

## 2016-06-16 DIAGNOSIS — Z0000 Encounter for general adult medical examination without abnormal findings: Secondary | ICD-10-CM | POA: Diagnosis not present

## 2016-06-16 MED ORDER — VALSARTAN 40 MG PO TABS: 40.0000 mg | ORAL_TABLET | Freq: Every day | ORAL | 3 refills | Status: DC

## 2016-06-16 MED ORDER — METOPROLOL SUCCINATE ER 25 MG PO TB24: 25.0000 mg | ORAL_TABLET | Freq: Every day | ORAL | 3 refills | Status: DC

## 2016-06-16 NOTE — Patient Instructions (Signed)
Congratulations on your weight loss!  Continue exercising. You should be getting 150 minutes of moderate intensity exercise weekly.  Schedule a lab only appointment at your convenience and come fasting at least four hours prior to this appointment. You may have water and black coffee.  I sent refills of your medications to your pharmacy.  Follow up either in the Fall 2018 for your Pap or in 1 year for your annual exam with pap.  It was a pleasure to meet you today! Please don't hesitate to call me with any questions. Welcome to [facility]!

## 2016-06-16 NOTE — Assessment & Plan Note (Signed)
Managed on long term PPI due to return of symptoms on step down therapy. Continue to monitor, she is working on weight loss.

## 2016-06-16 NOTE — Progress Notes (Signed)
Subjective:    Patient ID: Amanda Acosta, female    DOB: 1975-05-10, 41 y.o.   MRN: [id number]  HPI  Amanda Acosta is a 41 year old female who presents today to transfer care from [facility] and for complete physical.  1) Essential Hypertension: Diagnosed during her third pregnancyCurrently managed on valsartan 40 mg and Toprol XL 25 mg. Her blood pressure in the clinic today is 122/84. She does have a strong family history of hypertension. She checks her BP at home and gets readings of 120's/80's.   2) GERD: Currently managed on omeprazole 20 mg. She will experience esophageal burning and pressure without her medication. She takes this daily. She is working on weight loss through healthy diet and exercise.   Immunizations: -Tetanus: Completed in 2011. -Influenza: Completed in Fall 2017.   Diet: She endorses a healthy diet. Currently on weight watchers. Breakfast: Eggs Lunch: Salad with protein Dinner: Pasta, meat, vegetable, fast food one weekly Snacks: Eggs, nuts, fruit Desserts: Occasionally, halo top  Beverages: Diet soda, coffee  Exercise: She exercises at the gym 2-3 times weekly. She will walk on her treadmill once weekly at home. Eye exam: Completed in 2017. Dental exam: Completes semi-annually Pap Smear: Due in November 2018. Mammogram: Would like to wait until age 62.    Review of Systems  Constitutional: Negative for unexpected weight change.  HENT: Negative for rhinorrhea.   Respiratory: Negative for cough and shortness of breath.   Cardiovascular: Negative for chest pain.  Gastrointestinal: Negative for constipation and diarrhea.  Genitourinary: Negative for difficulty urinating and menstrual problem.  Musculoskeletal: Negative for arthralgias and myalgias.  Skin: Negative for rash.  Allergic/Immunologic: Negative for environmental allergies.  Neurological: Negative for dizziness, numbness and headaches.  Psychiatric/Behavioral:       Denies  concerns for anxiety or depression       Past Medical History:  Diagnosis Date  . Arrhythmia   . Endometriosis   . Heart murmur   . History of genital warts   . History of urinary tract infection   . Hypertension    started with pregnancy, evaluated by cardiology     Social History   Social History  . Marital status: Married    Spouse name: N/A  . Number of children: N/A  . Years of education: N/A   Occupational History  . Not on file.   Social History Main Topics  . Smoking status: Never Smoker  . Smokeless tobacco: Never Used  . Alcohol use No  . Drug use: No  . Sexual activity: Not on file   Other Topics Concern  . Not on file   Social History Narrative   Lives in [place] with husband and children. 1 dog   Work - [employer], Press photographer, works 2 days per week.   Hobbies - spending time with family          Past Surgical History:  Procedure Laterality Date  . OOPHORECTOMY  2014   right, [facility]  . OVARIAN CYST REMOVAL    . TUBAL LIGATION    . VAGINAL DELIVERY     2    Family History  Problem Relation Age of Onset  . Heart disease Paternal Grandmother   . Hypertension Paternal Grandmother   . Heart disease Paternal Grandfather   . Hypertension Paternal Grandfather   . Hypertension Father   . Cancer Father 77    NHL    No Known Allergies  No current outpatient  prescriptions on file prior to visit.   No current facility-administered medications on file prior to visit.     BP 122/84   Pulse 89   Temp 98.9 F (37.2 C) (Oral)   Ht 5' 7.5" (1.715 m)   Wt 207 lb 12.8 oz (94.3 kg)   LMP 05/01/2016   SpO2 96%   BMI 32.07 kg/m    Objective:   Physical Exam  Constitutional: She is oriented to person, place, and time. She appears well-nourished.  HENT:  Right Ear: Tympanic membrane and ear canal normal.  Left Ear: Tympanic membrane and ear canal normal.  Nose: Nose normal.  Mouth/Throat: Oropharynx is clear and moist.  Eyes:  Conjunctivae and EOM are normal. Pupils are equal, round, and reactive to light.  Neck: Neck supple. No thyromegaly present.  Cardiovascular: Normal rate and regular rhythm.   No murmur heard. Pulmonary/Chest: Effort normal and breath sounds normal. She has no rales.  Abdominal: Soft. Bowel sounds are normal. There is no tenderness.  Musculoskeletal: Normal range of motion.  Lymphadenopathy:    She has no cervical adenopathy.  Neurological: She is alert and oriented to person, place, and time. She has normal reflexes. No cranial nerve deficit.  Skin: Skin is warm and dry. No rash noted.  Psychiatric: She has a normal mood and affect.          Assessment & Plan:

## 2016-06-16 NOTE — Progress Notes (Signed)
Pre visit review using our clinic review tool, if applicable. No additional management support is needed unless otherwise documented below in the visit note. 

## 2016-06-16 NOTE — Assessment & Plan Note (Signed)
Stable today, continue valsartan and Toprol XL. BMP pending today. Continue current regimen.

## 2016-06-16 NOTE — Assessment & Plan Note (Signed)
Immunizations UTD. Pap due in Fall 2018, she will return at that time. Would like to defer mammogram until age 41. Discussed the importance of a healthy diet and regular exercise in order for weight loss, and to reduce the risk of other medical diseases. Exam unremarkable. Labs pending. Follow up in 1 year for annual exam.

## 2016-06-25 ENCOUNTER — Other Ambulatory Visit (INDEPENDENT_AMBULATORY_CARE_PROVIDER_SITE_OTHER): Payer: BC Managed Care – PPO

## 2016-06-25 DIAGNOSIS — I10 Essential (primary) hypertension: Secondary | ICD-10-CM | POA: Diagnosis not present

## 2016-06-25 DIAGNOSIS — E559 Vitamin D deficiency, unspecified: Secondary | ICD-10-CM

## 2016-06-25 LAB — COMPREHENSIVE METABOLIC PANEL
ALK PHOS: 43 U/L (ref 39–117)
ALT: 14 U/L (ref 0–35)
AST: 14 U/L (ref 0–37)
Albumin: 4.2 g/dL (ref 3.5–5.2)
BILIRUBIN TOTAL: 0.4 mg/dL (ref 0.2–1.2)
BUN: 11 mg/dL (ref 6–23)
CALCIUM: 9.2 mg/dL (ref 8.4–10.5)
CO2: 28 mEq/L (ref 19–32)
Chloride: 105 mEq/L (ref 96–112)
Creatinine, Ser: 0.84 mg/dL (ref 0.40–1.20)
GFR: 79.07 mL/min (ref >60.00)
GLUCOSE: 102 mg/dL — AB (ref 70–99)
Potassium: 4.4 mEq/L (ref 3.5–5.1)
Sodium: 139 mEq/L (ref 135–145)
TOTAL PROTEIN: 6.4 g/dL (ref 6.0–8.3)

## 2016-06-25 LAB — LIPID PANEL
Cholesterol: 179 mg/dL (ref 0–200)
HDL: 54.8 mg/dL
LDL Cholesterol: 115 mg/dL — ABNORMAL HIGH (ref 0–99)
NonHDL: 124.02
Total CHOL/HDL Ratio: 3
Triglycerides: 46 mg/dL (ref 0.0–149.0)
VLDL: 9.2 mg/dL (ref 0.0–40.0)

## 2016-06-25 LAB — VITAMIN D 25 HYDROXY (VIT D DEFICIENCY, FRACTURES): VITD: 24.03 ng/mL — ABNORMAL LOW (ref 30.00–100.00)

## 2016-10-12 ENCOUNTER — Telehealth: Payer: Self-pay

## 2016-10-12 ENCOUNTER — Encounter: Payer: Self-pay | Admitting: Primary Care

## 2016-10-12 DIAGNOSIS — I10 Essential (primary) hypertension: Secondary | ICD-10-CM

## 2016-10-12 MED ORDER — LOSARTAN POTASSIUM 25 MG PO TABS: 25.0000 mg | ORAL_TABLET | Freq: Every day | ORAL | 0 refills | Status: DC

## 2016-10-12 NOTE — Telephone Encounter (Signed)
Rx for losartan 25 mg sent to pharmacy. Take 1 tablet by mouth once daily. Please have her scheduled in our office for BP follow up in 2 weeks.

## 2016-10-12 NOTE — Telephone Encounter (Signed)
Spoken and notified patient of Kate's comments. Patient stated that she believe she need another medication in placed of the valsartan. 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

## 2016-10-12 NOTE — Telephone Encounter (Signed)
She's actually on a very low dose of valsartan. It would be acceptable to have her stop the valsartan and monitor her BP on the Toprol XL alone. Please have her monitor her readings over the next 2 weeks, we will call her for readings then. Please have her message or call sooner if BP gets above 135/90 on a consistent basis.

## 2016-10-12 NOTE — Telephone Encounter (Signed)
Pt left v/m; pt went to [facility] [address] [place] to pick up valsartan; pt was advised valsartan had been recalled and pt to contact PCP for substitute med. Pt request cb when done.

## 2016-10-13 NOTE — Telephone Encounter (Signed)
This was addressed in message through [place].

## 2016-11-08 ENCOUNTER — Other Ambulatory Visit: Payer: Self-pay | Admitting: Primary Care

## 2016-11-08 DIAGNOSIS — I10 Essential (primary) hypertension: Secondary | ICD-10-CM

## 2016-11-10 ENCOUNTER — Ambulatory Visit (INDEPENDENT_AMBULATORY_CARE_PROVIDER_SITE_OTHER): Payer: BC Managed Care – PPO | Admitting: Primary Care

## 2016-11-10 ENCOUNTER — Encounter: Payer: Self-pay | Admitting: Primary Care

## 2016-11-10 VITALS — BP 144/92 | HR 73 | Temp 98.5°F | Ht 68.0 in | Wt 218.8 lb

## 2016-11-10 DIAGNOSIS — I10 Essential (primary) hypertension: Secondary | ICD-10-CM

## 2016-11-10 MED ORDER — LOSARTAN POTASSIUM 50 MG PO TABS: 50.0000 mg | ORAL_TABLET | Freq: Every day | ORAL | 0 refills | Status: DC

## 2016-11-10 NOTE — Patient Instructions (Signed)
Start losartan 50 mg tablets for high blood pressure. Continue metoprolol succinate 25 mg.  Continue to monitor your blood pressure, we will call you for readings in two weeks.  It was a pleasure to see you today!

## 2016-11-10 NOTE — Assessment & Plan Note (Signed)
Above goal since switching to losartan 25 mg from valsartan 40 mg. Will increase to losartan 50 mg. She will monitor her readings, we will call her in 2 weeks.

## 2016-11-10 NOTE — Progress Notes (Signed)
Subjective:    Patient ID: Amanda Acosta, female    DOB: [birth year], 41 y.o.   MRN: [id number]  HPI  Amanda Acosta is a 41 year old female with a history of hypertension who presents today for hypertension follow up. She was previously managed on Valsartan 40 mg once daily but this was recently recalled by the manufacturer and was switched to losartan 25 mg once daily. She is also managed on Toprol XL 25 mg.   Her blood pressure in the office today is 144/92. She's been checking her blood pressure at home and is getting readings of 120's-150's/80's-100's (mostly 140's/90's). She denies chest pain, dizziness, visual changes.   Review of Systems  Constitutional: Negative for fatigue.  Eyes: Negative for visual disturbance.  Respiratory: Negative for shortness of breath.   Cardiovascular: Negative for chest pain.  Neurological: Negative for dizziness and headaches.       Past Medical History:  Diagnosis Date  . Arrhythmia   . Endometriosis   . Heart murmur   . History of genital warts   . History of urinary tract infection   . Hypertension    started with pregnancy, evaluated by cardiology     Social History   Social History  . Marital status: Married    Spouse name: N/A  . Number of children: N/A  . Years of education: N/A   Occupational History  . Not on file.   Social History Main Topics  . Smoking status: Never Smoker  . Smokeless tobacco: Never Used  . Alcohol use No  . Drug use: No  . Sexual activity: Not on file   Other Topics Concern  . Not on file   Social History Narrative   Lives in [place] with husband and children. 1 dog   Work - [employer], Press photographer, works 2 days per week.   Hobbies - spending time with family          Past Surgical History:  Procedure Laterality Date  . OOPHORECTOMY  2014   right, [facility]  . OVARIAN CYST REMOVAL    . TUBAL LIGATION    . VAGINAL DELIVERY     2    Family History  Problem Relation Age of  Onset  . Heart disease Paternal Grandmother   . Hypertension Paternal Grandmother   . Heart disease Paternal Grandfather   . Hypertension Paternal Grandfather   . Hypertension Father   . Cancer Father 70       NHL    No Known Allergies  Current Outpatient Prescriptions on File Prior to Visit  Medication Sig Dispense Refill  . metoprolol succinate (TOPROL-XL) 25 MG 24 hr tablet Take 1 tablet (25 mg total) by mouth daily. 90 tablet 3  . omeprazole (PRILOSEC) 20 MG capsule Take 20 mg by mouth daily.     No current facility-administered medications on file prior to visit.     BP (!) 144/92   Pulse 73   Temp 98.5 F (36.9 C) (Oral)   Ht 5' 8"  (1.727 m)   Wt 218 lb 12.8 oz (99.2 kg)   LMP 10/28/2016   SpO2 99%   BMI 33.27 kg/m    Objective:   Physical Exam  Constitutional: She appears well-nourished.  Neck: Neck supple.  Cardiovascular: Normal rate and regular rhythm.   Pulmonary/Chest: Effort normal and breath sounds normal.  Skin: Skin is warm and dry.          Assessment & Plan:

## 2016-11-15 ENCOUNTER — Encounter: Payer: Self-pay | Admitting: Primary Care

## 2016-11-24 ENCOUNTER — Telehealth: Payer: Self-pay | Admitting: Primary Care

## 2016-11-24 NOTE — Telephone Encounter (Signed)
-----   Message from Pleas Koch, NP sent at 11/10/2016 10:07 AM EDT ----- Regarding: BP Check on BP since we increased her losartan to 50 mg.

## 2016-11-24 NOTE — Telephone Encounter (Signed)
Send patient a message through [place].

## 2016-11-25 NOTE — Telephone Encounter (Signed)
Patient respond through My Chart

## 2016-11-25 NOTE — Telephone Encounter (Signed)
Noted  

## 2017-02-23 ENCOUNTER — Encounter: Payer: Self-pay | Admitting: Obstetrics and Gynecology

## 2017-02-23 ENCOUNTER — Ambulatory Visit: Payer: BC Managed Care – PPO | Admitting: Obstetrics and Gynecology

## 2017-02-23 VITALS — BP 140/90 | HR 95 | Ht 68.0 in | Wt 219.0 lb

## 2017-02-23 DIAGNOSIS — E669 Obesity, unspecified: Secondary | ICD-10-CM | POA: Diagnosis not present

## 2017-02-23 DIAGNOSIS — Z6833 Body mass index (BMI) 33.0-33.9, adult: Secondary | ICD-10-CM | POA: Diagnosis not present

## 2017-02-23 MED ORDER — INSULIN PEN NEEDLE 32G X 6 MM MISC: 3 refills | Status: DC

## 2017-02-23 MED ORDER — LIRAGLUTIDE -WEIGHT MANAGEMENT 18 MG/3ML ~~LOC~~ SOPN: 3.0000 mg | PEN_INJECTOR | Freq: Every day | SUBCUTANEOUS | 2 refills | Status: DC

## 2017-02-23 NOTE — Progress Notes (Signed)
Gynecology Office Visit  Chief Complaint:  Chief Complaint  Patient presents with  . Weight Loss    History of Present Illness: Patientis a 41 y.o. [id number] female, who presents for the evaluation of weight gain. She has fluctuated up and down 10lbs for several years but has been unable to maintain and meaningful weight loss. The patient states the following issues have contributed to her weight problem: none.  The patient has no additional symptoms. The patient specifically denies memory loss, muscle weakness, excessive thirst, and polyuria. Weight related co-morbidities include HTN. She has not previously tried any medical weight loss  Review of Systems: 10 point review of systems negative unless otherwise noted in HPI  Past Medical History:  Past Medical History:  Diagnosis Date  . Arrhythmia   . Endometriosis   . Heart murmur   . History of genital warts   . History of urinary tract infection   . Hypertension    started with pregnancy, evaluated by cardiology    Past Surgical History:  Past Surgical History:  Procedure Laterality Date  . OOPHORECTOMY  2014   right, [facility]  . OVARIAN CYST REMOVAL    . TUBAL LIGATION    . VAGINAL DELIVERY     2    Gynecologic History: Patient's last menstrual period was 10/28/2016.  Obstetric History: [id number]  Family History:  Family History  Problem Relation Age of Onset  . Heart disease Paternal Grandmother   . Hypertension Paternal Grandmother   . Heart disease Paternal Grandfather   . Hypertension Paternal Grandfather   . Hypertension Father   . Cancer Father 20       NHL    Social History:  Social History   Socioeconomic History  . Marital status: Married    Spouse name: Not on file  . Number of children: Not on file  . Years of education: Not on file  . Highest education level: Not on file  Social Needs  . Financial resource strain: Not on file  . Food insecurity - worry: Not on file  . Food  insecurity - inability: Not on file  . Transportation needs - medical: Not on file  . Transportation needs - non-medical: Not on file  Occupational History  . Not on file  Tobacco Use  . Smoking status: Never Smoker  . Smokeless tobacco: Never Used  Substance and Sexual Activity  . Alcohol use: No    Alcohol/week: 0.0 oz  . Drug use: No  . Sexual activity: Yes    Birth control/protection: Surgical  Other Topics Concern  . Not on file  Social History Narrative   Lives in [place] with husband and children. 1 dog   Work - [employer], Press photographer, works 2 days per week.   Hobbies - spending time with family       Allergies:  No Known Allergies  Medications: Prior to Admission medications   Medication Sig Start Date End Date Taking? Authorizing Provider  losartan (COZAAR) 50 MG tablet Take 1 tablet (50 mg total) by mouth daily. 11/10/16   Pleas Koch, NP  metoprolol succinate (TOPROL-XL) 25 MG 24 hr tablet Take 1 tablet (25 mg total) by mouth daily. 06/16/16   Pleas Koch, NP  omeprazole (PRILOSEC) 20 MG capsule Take 20 mg by mouth daily.    [provider]    Physical Exam Vitals:  Vitals:   02/23/17 1505  BP: 140/90  Pulse: 95   Patient's last menstrual  period was 10/28/2016.  General: NAD HEENT: normocephalic, anicteric Thyroid: no enlargement Pulmonary: no increased work of breathing Neurologic: Grossly intact Psychiatric: mood appropriate, affect full  Assessment: 41 y.o. V6P7948 presenting for discussion of weight loss management options  Plan: Problem List Items Addressed This Visit    None    Visit Diagnoses    Class 1 obesity with serious comorbidity and body mass index (BMI) of 33.0 to 33.9 in adult, unspecified obesity type    -  Primary   Relevant Medications   Liraglutide -Weight Management (SAXENDA) 18 MG/3ML SOPN      1) 1500 Calorie ADA Diet  2) Patient education given regarding appropriate lifestyle changes for weight loss  including: regular physical activity, healthy coping strategies, caloric restriction and healthy eating patterns.  3) Patient will be started on weight loss medication. The risks and benefits and side effects of medication, such as Adipex (Phenteramine) ,  Tenuate (Diethylproprion), Belviq (lorcarsin), Contrave (buproprion/naltrexone), Qsymia (phentermine/topiramate), and Saxenda (liraglutide) is discussed. The pros and cons of suppressing appetite and boosting metabolism is discussed. Risks of tolerence and addiction is discussed for selected agents discussed. Use of medicine will ne short term, such as 3-4 months at a time followed by a period of time off of the medicine to avoid these risks and side effects for Adipex, Qsymia, and Tenuate discussed. Pt to call with any negative side effects and agrees to keep follow up appts.  4) Comorbidity Screening - hypothyroidism screening, diabetes, and hyperlipidemia screening previously done by PCP  5) Encouraged weekly weight monitorig to track progress and sample 1 week food diary  6) Contraception - discussed that all weight loss drugs fall in to pregnancy category X, patient currently has reliable contraception in the form of tubal ligation  7) 15 minutes face-to-face; counseling/coordination of care > 50 percent of visit  8) Follow up in 12 weeks to assess response sample of SAXENDA given

## 2017-05-04 ENCOUNTER — Other Ambulatory Visit: Payer: Self-pay | Admitting: Primary Care

## 2017-05-04 DIAGNOSIS — I10 Essential (primary) hypertension: Secondary | ICD-10-CM

## 2017-05-24 ENCOUNTER — Ambulatory Visit: Payer: BC Managed Care – PPO | Admitting: Obstetrics and Gynecology

## 2017-05-24 ENCOUNTER — Encounter: Payer: Self-pay | Admitting: Obstetrics and Gynecology

## 2017-05-24 VITALS — BP 130/88 | HR 110 | Ht 68.0 in | Wt 206.0 lb

## 2017-05-24 DIAGNOSIS — E669 Obesity, unspecified: Secondary | ICD-10-CM

## 2017-05-24 DIAGNOSIS — Z6831 Body mass index (BMI) 31.0-31.9, adult: Secondary | ICD-10-CM

## 2017-05-24 NOTE — Progress Notes (Signed)
Gynecology Office Visit  Chief Complaint:  Chief Complaint  Patient presents with  . weight loss check    History of Present Illness: Patientis a 42 y.o. [id number] female, who presents for the evaluation of the desire to lose weight. She has lost 13 pounds. The patient states the following symptoms since starting her weight loss therapy: appetite suppression, energy, and weight loss.  The patient also reports no other ill effects. The patient specifically denies heart palpitations, anxiety, and insomnia.    Review of Systems: 10 point review of systems negative unless otherwise noted in HPI  Past Medical History:  Past Medical History:  Diagnosis Date  . Arrhythmia   . Endometriosis   . Heart murmur   . History of genital warts   . History of urinary tract infection   . Hypertension    started with pregnancy, evaluated by cardiology    Past Surgical History:  Past Surgical History:  Procedure Laterality Date  . OOPHORECTOMY  2014   right, [facility]  . OVARIAN CYST REMOVAL    . TUBAL LIGATION    . VAGINAL DELIVERY     2    Gynecologic History: No LMP recorded.  Obstetric History: [id number]  Family History:  Family History  Problem Relation Age of Onset  . Heart disease Paternal Grandmother   . Hypertension Paternal Grandmother   . Heart disease Paternal Grandfather   . Hypertension Paternal Grandfather   . Hypertension Father   . Cancer Father 33       NHL    Social History:  Social History   Socioeconomic History  . Marital status: Married    Spouse name: Not on file  . Number of children: Not on file  . Years of education: Not on file  . Highest education level: Not on file  Social Needs  . Financial resource strain: Not on file  . Food insecurity - worry: Not on file  . Food insecurity - inability: Not on file  . Transportation needs - medical: Not on file  . Transportation needs - non-medical: Not on file  Occupational History  .  Not on file  Tobacco Use  . Smoking status: Never Smoker  . Smokeless tobacco: Never Used  Substance and Sexual Activity  . Alcohol use: No    Alcohol/week: 0.0 oz  . Drug use: No  . Sexual activity: Yes    Birth control/protection: Surgical  Other Topics Concern  . Not on file  Social History Narrative   Lives in [place] with husband and children. 1 dog   Work - [employer], Press photographer, works 2 days per week.   Hobbies - spending time with family       Allergies:  No Known Allergies  Medications: Prior to Admission medications   Medication Sig Start Date End Date Taking? Authorizing Provider  Insulin Pen Needle (NOVOFINE) 32G X 6 MM MISC Once daily as directed 02/23/17  Yes Malachy Mood, MD  Liraglutide -Weight Management (SAXENDA) 18 MG/3ML SOPN Inject 3 mg into the skin daily. 02/23/17  Yes Malachy Mood, MD  losartan (COZAAR) 50 MG tablet Take 1 tablet (50 mg total) by mouth daily. 11/10/16  Yes Pleas Koch, NP  metoprolol succinate (TOPROL-XL) 25 MG 24 hr tablet Take 1 tablet (25 mg total) by mouth daily. 06/16/16  Yes Pleas Koch, NP  omeprazole (PRILOSEC) 20 MG capsule Take 20 mg by mouth daily.   Yes [provider]  Physical Exam Blood pressure 130/88, pulse (!) 110, height 5' 8"  (1.727 m), weight 206 lb (93.4 kg). Wt Readings from Last 3 Encounters:  05/24/17 206 lb (93.4 kg)  02/23/17 219 lb (99.3 kg)  11/10/16 218 lb 12.8 oz (99.2 kg)  Body mass index is 31.32 kg/m.  - 13 lbs  General: NAD HEENT: normocephalic, anicteric Thyroid: no enlargement Pulmonary: no increased work of breathing Neurologic: Grossly intact Psychiatric: mood appropriate, affect full  Assessment: 42 y.o. [id number] medical weight loss follow up Plan: Problem List Items Addressed This Visit    None    Visit Diagnoses    Class 1 obesity without serious comorbidity with body mass index (BMI) of 31.0 to 31.9 in adult, unspecified obesity type    -  Primary       1) 1500 Calorie ADA Diet  2) Patient education given regarding appropriate lifestyle changes for weight loss including: regular physical activity, healthy coping strategies, caloric restriction and healthy eating patterns.  3) Patient will be started on weight loss medication. The risks and benefits and side effects of medication, such as Adipex (Phenteramine) ,  Tenuate (Diethylproprion), Belviq (lorcarsin), Contrave (buproprion/naltrexone), Qsymia (phentermine/topiramate), and Saxenda (liraglutide) is discussed. The pros and cons of suppressing appetite and boosting metabolism is discussed. Risks of tolerence and addiction is discussed for selected agents discussed. Use of medicine will ne short term, such as 3-4 months at a time followed by a period of time off of the medicine to avoid these risks and side effects for Adipex, Qsymia, and Tenuate discussed. Pt to call with any negative side effects and agrees to keep follow up appts.  4) Patient to take medication, with the benefits of appetite suppression and metabolism boost d/w pt, along with the side effects and risk factors of long term use that will be avoided with our use of short bursts of therapy. Rx provided.  - continue Chrisandra Carota has achieved appropriate weight loss in 3 months   5) 15 minutes face-to-face; with counseling/coordination of care > 50 percent of visit related to obesity and ongoing management/treatment   6) RTC 9 months for annual   Malachy Mood, MD, [place], [facility] 05/24/2017, 3:46 PM

## 2017-05-31 ENCOUNTER — Other Ambulatory Visit: Payer: Self-pay | Admitting: Obstetrics and Gynecology

## 2017-05-31 MED ORDER — LIRAGLUTIDE -WEIGHT MANAGEMENT 18 MG/3ML ~~LOC~~ SOPN: 3.0000 mg | PEN_INJECTOR | Freq: Every day | SUBCUTANEOUS | 11 refills | Status: DC

## 2017-05-31 MED ORDER — INSULIN PEN NEEDLE 32G X 6 MM MISC: 3 refills | Status: DC

## 2017-05-31 NOTE — Telephone Encounter (Signed)
I sent it but she may need prior authorization to document her 3 month weight loss

## 2017-05-31 NOTE — Telephone Encounter (Signed)
Please advise 

## 2017-06-17 ENCOUNTER — Telehealth: Payer: Self-pay | Admitting: Primary Care

## 2017-06-17 DIAGNOSIS — I10 Essential (primary) hypertension: Secondary | ICD-10-CM

## 2017-06-17 MED ORDER — LOSARTAN POTASSIUM 50 MG PO TABS: 50.0000 mg | ORAL_TABLET | Freq: Every day | ORAL | 0 refills | Status: DC

## 2017-06-17 MED ORDER — METOPROLOL SUCCINATE ER 25 MG PO TB24: 25.0000 mg | ORAL_TABLET | Freq: Every day | ORAL | 2 refills | Status: DC

## 2017-06-17 NOTE — Telephone Encounter (Signed)
Copied from [place]. Topic: Quick Communication - Rx Refill/Question >> Jun 17, 2017 11:03 AM Cleaster Corin, NT wrote: Medicationlosartan (COZAAR) 50 MG tablet [[id number]:  and metoprolol succinate (TOPROL-XL) 25 MG 24 hr tablet [[id number]]  Has the patient contacted their pharmacy? yes (Agent: If no, request that the patient contact the pharmacy for the refill.) Preferred Pharmacy (with phone number or street name): [facility] [place], [place] - [address] [place] [address] Phone: [phone] Fax: [phone]   Agent: Please be advised that RX refills may take up to 3 business days. We ask that you follow-up with your pharmacy.

## 2017-07-30 ENCOUNTER — Encounter: Payer: Self-pay | Admitting: Primary Care

## 2017-07-30 ENCOUNTER — Other Ambulatory Visit (HOSPITAL_COMMUNITY)
Admission: RE | Admit: 2017-07-30 | Discharge: 2017-07-30 | Disposition: A | Discharge status: Home / Self Care | Payer: BC Managed Care – PPO | Source: Ambulatory Visit | Attending: Primary Care | Admitting: Primary Care

## 2017-07-30 ENCOUNTER — Ambulatory Visit (INDEPENDENT_AMBULATORY_CARE_PROVIDER_SITE_OTHER): Payer: BC Managed Care – PPO | Admitting: Primary Care

## 2017-07-30 VITALS — BP 122/76 | HR 68 | Temp 97.7°F | Ht 68.0 in | Wt 201.8 lb

## 2017-07-30 DIAGNOSIS — K219 Gastro-esophageal reflux disease without esophagitis: Secondary | ICD-10-CM

## 2017-07-30 DIAGNOSIS — Z0000 Encounter for general adult medical examination without abnormal findings: Secondary | ICD-10-CM

## 2017-07-30 DIAGNOSIS — E559 Vitamin D deficiency, unspecified: Secondary | ICD-10-CM

## 2017-07-30 DIAGNOSIS — Z124 Encounter for screening for malignant neoplasm of cervix: Secondary | ICD-10-CM

## 2017-07-30 DIAGNOSIS — I10 Essential (primary) hypertension: Secondary | ICD-10-CM

## 2017-07-30 LAB — LIPID PANEL
CHOLESTEROL: 200 mg/dL (ref 0–200)
HDL: 59.2 mg/dL (ref >39.00)
LDL CALC: 131 mg/dL — AB (ref 0–99)
NonHDL: 141.27
TRIGLYCERIDES: 49 mg/dL (ref 0.0–149.0)
Total CHOL/HDL Ratio: 3
VLDL: 9.8 mg/dL (ref 0.0–40.0)

## 2017-07-30 LAB — COMPREHENSIVE METABOLIC PANEL
ALBUMIN: 4.4 g/dL (ref 3.5–5.2)
ALT: 30 U/L (ref 0–35)
AST: 21 U/L (ref 0–37)
Alkaline Phosphatase: 53 U/L (ref 39–117)
BUN: 13 mg/dL (ref 6–23)
CALCIUM: 9.5 mg/dL (ref 8.4–10.5)
CHLORIDE: 102 meq/L (ref 96–112)
CO2: 28 mEq/L (ref 19–32)
Creatinine, Ser: 0.86 mg/dL (ref 0.40–1.20)
GFR: 76.55 mL/min (ref >60.00)
Glucose, Bld: 87 mg/dL (ref 70–99)
POTASSIUM: 4.2 meq/L (ref 3.5–5.1)
Sodium: 138 mEq/L (ref 135–145)
Total Bilirubin: 0.5 mg/dL (ref 0.2–1.2)
Total Protein: 6.9 g/dL (ref 6.0–8.3)

## 2017-07-30 LAB — VITAMIN D 25 HYDROXY (VIT D DEFICIENCY, FRACTURES): VITD: 30.67 ng/mL (ref 30.00–100.00)

## 2017-07-30 MED ORDER — LOSARTAN POTASSIUM 25 MG PO TABS: 25.0000 mg | ORAL_TABLET | Freq: Every day | ORAL | 3 refills | Status: DC

## 2017-07-30 MED ORDER — METOPROLOL SUCCINATE ER 25 MG PO TB24: 25.0000 mg | ORAL_TABLET | Freq: Every day | ORAL | 3 refills | Status: DC

## 2017-07-30 NOTE — Assessment & Plan Note (Signed)
Well controlled on omeprazole 20 mg, will experience chest burning without medication. Continue same.

## 2017-07-30 NOTE — Assessment & Plan Note (Signed)
BP well controlled on Losartan 25 mg and metoprolol succinate 25 mg. Refills sent to pharmacy.

## 2017-07-30 NOTE — Assessment & Plan Note (Signed)
Immunizations UTD. Pap smear due, pending. Mammogram due, orders placed. Commended her on weight loss efforts, encouraged to continue. Exam unremarkable. Labs pending. Follow up in 1 year

## 2017-07-30 NOTE — Patient Instructions (Signed)
Stop by the lab prior to leaving today. I will notify you of your results once received.   Call the [facility] to schedule your mammogram.   Continue exercising. You should be getting 150 minutes of moderate intensity exercise weekly.  Continue to work on a healthy diet. Ensure you are consuming 64 ounces of water daily.  We will be in touch once we receive your pap smear results.  Continue Losartan 25 mg and metoprolol succinate 25 mg.  Follow up in 1 year for your annual exam or sooner if needed.  It was a pleasure to see you today!

## 2017-07-30 NOTE — Progress Notes (Signed)
Subjective:    Patient ID: Amanda Acosta, female    DOB: 03/28/1976, 42 y.o.   MRN: [id number]  HPI  Amanda Acosta is a 42 year old female who presents today for complete physical.  About 6 months ago she started noticing feeling lightheaded, especially when working out. She's been cutting her Losartan 50 mg in half for about 6 months and is getting readings of 120's/70's-80's. She is compliant to her metoprolol.   Immunizations: -Tetanus: Completed in 2011 -Influenza: Completed last season    Diet: She endorses a healthy diet. Breakfast: Egg and cheese wrap Lunch: Salad, frozen dinner, sandwich Dinner: Meat, vegetable, starch Snacks: Protein bar Desserts: Ice cream. 3 days weekly. Beverages: Coffee, water, diet soda   Exercise: She is participating in boot camp 4 times weekly  Eye exam: Completed in 2019 Dental exam: Completes semi-annually  Pap Smear: Due  Mammogram: Never completed, due.   Wt Readings from Last 3 Encounters:  07/30/17 201 lb 12 oz (91.5 kg)  05/24/17 206 lb (93.4 kg)  02/23/17 219 lb (99.3 kg)     BP Readings from Last 3 Encounters:  07/30/17 122/76  05/24/17 130/88  02/23/17 140/90      Review of Systems  Constitutional: Negative for unexpected weight change.  HENT: Negative for rhinorrhea.   Respiratory: Negative for cough and shortness of breath.   Cardiovascular: Negative for chest pain.  Gastrointestinal: Negative for constipation and diarrhea.       GERD  Genitourinary: Negative for difficulty urinating and menstrual problem.  Musculoskeletal: Negative for arthralgias and myalgias.  Skin: Negative for rash.  Allergic/Immunologic: Negative for environmental allergies.  Neurological: Negative for dizziness, numbness and headaches.  Psychiatric/Behavioral: The patient is not nervous/anxious.        Past Medical History:  Diagnosis Date  . Arrhythmia   . Endometriosis   . Heart murmur   . History of genital warts   . History of  urinary tract infection   . Hypertension    started with pregnancy, evaluated by cardiology     Social History   Socioeconomic History  . Marital status: Married    Spouse name: Not on file  . Number of children: Not on file  . Years of education: Not on file  . Highest education level: Not on file  Occupational History  . Not on file  Social Needs  . Financial resource strain: Not on file  . Food insecurity:    Worry: Not on file    Inability: Not on file  . Transportation needs:    Medical: Not on file    Non-medical: Not on file  Tobacco Use  . Smoking status: Never Smoker  . Smokeless tobacco: Never Used  Substance and Sexual Activity  . Alcohol use: No    Alcohol/week: 0.0 oz  . Drug use: No  . Sexual activity: Yes    Birth control/protection: Surgical  Lifestyle  . Physical activity:    Days per week: Not on file    Minutes per session: Not on file  . Stress: Not on file  Relationships  . Social connections:    Talks on phone: Not on file    Gets together: Not on file    Attends religious service: Not on file    Active member of club or organization: Not on file    Attends meetings of clubs or organizations: Not on file    Relationship status: Not on file  . Intimate partner violence:  Fear of current or ex partner: Not on file    Emotionally abused: Not on file    Physically abused: Not on file    Forced sexual activity: Not on file  Other Topics Concern  . Not on file  Social History Narrative   Lives in [place] with husband and children. 1 dog   Work - [employer], Press photographer, works 2 days per week.   Hobbies - spending time with family       Past Surgical History:  Procedure Laterality Date  . OOPHORECTOMY  2014   right, [facility]  . OVARIAN CYST REMOVAL    . TUBAL LIGATION    . VAGINAL DELIVERY     2    Family History  Problem Relation Age of Onset  . Heart disease Paternal Grandmother   . Hypertension Paternal Grandmother     . Heart disease Paternal Grandfather   . Hypertension Paternal Grandfather   . Hypertension Father   . Cancer Father 38       NHL    No Known Allergies  Current Outpatient Medications on File Prior to Visit  Medication Sig Dispense Refill  . Insulin Pen Needle (NOVOFINE) 32G X 6 MM MISC Once daily as directed 100 each 3  . Liraglutide -Weight Management (SAXENDA) 18 MG/3ML SOPN Inject 3 mg into the skin daily. 5 pen 11  . omeprazole (PRILOSEC) 20 MG capsule Take 20 mg by mouth daily.     No current facility-administered medications on file prior to visit.     BP 122/76   Pulse 68   Temp 97.7 F (36.5 C) (Oral)   Ht 5' 8"  (1.727 m)   Wt 201 lb 12 oz (91.5 kg)   LMP 10/30/2016   SpO2 97%   BMI 30.68 kg/m    Objective:   Physical Exam  Constitutional: She is oriented to person, place, and time. She appears well-nourished.  HENT:  Right Ear: Tympanic membrane and ear canal normal.  Left Ear: Tympanic membrane and ear canal normal.  Nose: Nose normal.  Mouth/Throat: Oropharynx is clear and moist.  Eyes: Pupils are equal, round, and reactive to light. Conjunctivae and EOM are normal.  Neck: Neck supple. No thyromegaly present.  Cardiovascular: Normal rate and regular rhythm.  No murmur heard. Pulmonary/Chest: Effort normal and breath sounds normal. She has no rales.  Abdominal: Soft. Bowel sounds are normal. There is no tenderness.  Musculoskeletal: Normal range of motion.  Lymphadenopathy:    She has no cervical adenopathy.  Neurological: She is alert and oriented to person, place, and time. She has normal reflexes. No cranial nerve deficit.  Skin: Skin is warm and dry. No rash noted.  Psychiatric: She has a normal mood and affect.          Assessment & Plan:

## 2017-08-03 LAB — CYTOLOGY - PAP
DIAGNOSIS: NEGATIVE
HPV: NOT DETECTED

## 2018-01-10 ENCOUNTER — Other Ambulatory Visit: Payer: Self-pay | Admitting: Primary Care

## 2018-01-10 DIAGNOSIS — Z1231 Encounter for screening mammogram for malignant neoplasm of breast: Secondary | ICD-10-CM

## 2018-02-04 ENCOUNTER — Ambulatory Visit
Admission: RE | Admit: 2018-02-04 | Discharge: 2018-02-04 | Disposition: A | Discharge status: Home / Self Care | Payer: BC Managed Care – PPO | Source: Ambulatory Visit | Attending: Primary Care | Admitting: Primary Care

## 2018-02-04 ENCOUNTER — Encounter: Payer: Self-pay | Admitting: Radiology

## 2018-02-04 DIAGNOSIS — Z1231 Encounter for screening mammogram for malignant neoplasm of breast: Secondary | ICD-10-CM | POA: Insufficient documentation

## 2018-05-09 ENCOUNTER — Other Ambulatory Visit: Payer: Self-pay | Admitting: Obstetrics and Gynecology

## 2018-07-25 ENCOUNTER — Encounter: Payer: Self-pay | Admitting: Primary Care

## 2018-07-25 ENCOUNTER — Other Ambulatory Visit: Payer: Self-pay

## 2018-07-25 ENCOUNTER — Ambulatory Visit (INDEPENDENT_AMBULATORY_CARE_PROVIDER_SITE_OTHER): Payer: BC Managed Care – PPO | Admitting: Primary Care

## 2018-07-25 DIAGNOSIS — I10 Essential (primary) hypertension: Secondary | ICD-10-CM | POA: Diagnosis not present

## 2018-07-25 DIAGNOSIS — K219 Gastro-esophageal reflux disease without esophagitis: Secondary | ICD-10-CM | POA: Diagnosis not present

## 2018-07-25 MED ORDER — METOPROLOL SUCCINATE ER 25 MG PO TB24: 25.0000 mg | ORAL_TABLET | Freq: Every day | ORAL | 3 refills | Status: DC

## 2018-07-25 MED ORDER — LOSARTAN POTASSIUM 25 MG PO TABS: 25.0000 mg | ORAL_TABLET | Freq: Two times a day (BID) | ORAL | 0 refills | Status: DC

## 2018-07-25 NOTE — Assessment & Plan Note (Signed)
Above goal on losartan 25 mg and metoprolol succinate 25 mg. Increase losartan to 25 mg BID, continue metoprolol succinate.   Discussed to monitor BP and send readings in 2 weeks.  Labs pending.

## 2018-07-25 NOTE — Progress Notes (Signed)
Subjective:    Patient ID: Amanda Acosta, female    DOB: 17-Nov-1975, 43 y.o.   MRN: [id number]  HPI  Virtual Visit via Video Note  I connected with Amanda Acosta on 07/25/18 at 10:20 AM EDT by a video enabled telemedicine application and verified that I am speaking with the correct person using two identifiers.   I discussed the limitations of evaluation and management by telemedicine and the availability of in person appointments. The patient expressed understanding and agreed to proceed. She is at home, I am in the office.  History of Present Illness:  Amanda Acosta is a 43 year old female who presents today for follow up.  1) Essential Hypertension: Currently managed on losartan 25 mg and metoprolol succinate 25 mg once daily. She's been monitoring her BP at home and is getting readings of 120's/80's-90's. She attached readings to her My Chart account.   We increased her dose of Losartan to 50 mg one year ago but she was unable to tolerate due to dizziness. She is complaint to both metoprolol succinate and losartan. HR is running in the 80's resting. She denies dizziness, chest pain, shortness of breath.   BP Readings from Last 3 Encounters:  07/25/18 123/90  07/30/17 122/76  05/24/17 130/88     2) GERD: Currently managed on omeprazole 20 mg daily . Overall doing well.    Observations/Objective:  Alert and oriented. No distress. Appears well.  Assessment and Plan:  See problem based charting  Follow Up Instructions:  We've increased the dose of your losartan to 25 mg twice daily.   Continue your metoprolol succinate 25 mg once daily.  Please update me if you notice dizziness or have any problems on the increased dose of losartan.  Call [phone] to schedule a fasting lab only appointment. You should fast for four hours prior.   Send me some blood pressure readings via my chart in two weeks as discussed.  It was a pleasure to see you today! Amanda Bossier,  NP-C    I discussed the assessment and treatment plan with the patient. The patient was provided an opportunity to ask questions and all were answered. The patient agreed with the plan and demonstrated an understanding of the instructions.   The patient was advised to call back or seek an in-person evaluation if the symptoms worsen or if the condition fails to improve as anticipated.     Amanda Koch, NP    Review of Systems  Eyes: Negative for visual disturbance.  Respiratory: Negative for shortness of breath.   Cardiovascular: Negative for chest pain.  Neurological: Negative for dizziness and headaches.       Past Medical History:  Diagnosis Date  . Arrhythmia   . Endometriosis   . Heart murmur   . History of genital warts   . History of urinary tract infection   . Hypertension    started with pregnancy, evaluated by cardiology     Social History   Socioeconomic History  . Marital status: Married    Spouse name: Not on file  . Number of children: Not on file  . Years of education: Not on file  . Highest education level: Not on file  Occupational History  . Not on file  Social Needs  . Financial resource strain: Not on file  . Food insecurity:    Worry: Not on file    Inability: Not on file  . Transportation needs:    Medical:  Not on file    Non-medical: Not on file  Tobacco Use  . Smoking status: Never Smoker  . Smokeless tobacco: Never Used  Substance and Sexual Activity  . Alcohol use: No    Alcohol/week: 0.0 standard drinks  . Drug use: No  . Sexual activity: Yes    Birth control/protection: Surgical  Lifestyle  . Physical activity:    Days per week: Not on file    Minutes per session: Not on file  . Stress: Not on file  Relationships  . Social connections:    Talks on phone: Not on file    Gets together: Not on file    Attends religious service: Not on file    Active member of club or organization: Not on file    Attends meetings of  clubs or organizations: Not on file    Relationship status: Not on file  . Intimate partner violence:    Fear of current or ex partner: Not on file    Emotionally abused: Not on file    Physically abused: Not on file    Forced sexual activity: Not on file  Other Topics Concern  . Not on file  Social History Narrative   Lives in [place] with husband and children. 1 dog   Work - [employer], Press photographer, works 2 days per week.   Hobbies - spending time with family       Past Surgical History:  Procedure Laterality Date  . OOPHORECTOMY  2014   right, [facility]  . OVARIAN CYST REMOVAL    . TUBAL LIGATION    . VAGINAL DELIVERY     2    Family History  Problem Relation Age of Onset  . Heart disease Paternal Grandmother   . Hypertension Paternal Grandmother   . Heart disease Paternal Grandfather   . Hypertension Paternal Grandfather   . Hypertension Father   . Cancer Father 32       NHL  . Breast cancer Neg Hx     No Known Allergies  Current Outpatient Medications on File Prior to Visit  Medication Sig Dispense Refill  . losartan (COZAAR) 25 MG tablet Take 1 tablet (25 mg total) by mouth daily. 90 tablet 3  . metoprolol succinate (TOPROL-XL) 25 MG 24 hr tablet Take 1 tablet (25 mg total) by mouth daily. 90 tablet 3  . omeprazole (PRILOSEC) 20 MG capsule Take 20 mg by mouth daily.     No current facility-administered medications on file prior to visit.     BP 123/90   Pulse 80   LMP 10/30/2016    Objective:   Physical Exam  Constitutional: She is oriented to person, place, and time. She appears well-nourished.  Respiratory: Effort normal. No respiratory distress.  Neurological: She is alert and oriented to person, place, and time.  Skin: Skin is dry.  Psychiatric: She has a normal mood and affect.           Assessment & Plan:

## 2018-07-25 NOTE — Assessment & Plan Note (Signed)
Doing well on omeprazole 20 mg, continue same. 

## 2018-07-25 NOTE — Patient Instructions (Signed)
We've increased the dose of your losartan to 25 mg twice daily.   Continue your metoprolol succinate 25 mg once daily.  Please update me if you notice dizziness or have any problems on the increased dose of losartan.  Call [phone] to schedule a fasting lab only appointment. You should fast for four hours prior.   Send me some blood pressure readings via my chart in two weeks as discussed.  It was a pleasure to see you today! [name], NP-C

## 2018-08-08 ENCOUNTER — Other Ambulatory Visit: Payer: Self-pay

## 2018-08-08 ENCOUNTER — Other Ambulatory Visit: Payer: BC Managed Care – PPO

## 2018-08-12 ENCOUNTER — Other Ambulatory Visit (INDEPENDENT_AMBULATORY_CARE_PROVIDER_SITE_OTHER): Payer: BC Managed Care – PPO

## 2018-08-12 DIAGNOSIS — I10 Essential (primary) hypertension: Secondary | ICD-10-CM

## 2018-08-12 LAB — COMPREHENSIVE METABOLIC PANEL
ALT: 24 U/L (ref 0–35)
AST: 23 U/L (ref 0–37)
Albumin: 4.4 g/dL (ref 3.5–5.2)
Alkaline Phosphatase: 48 U/L (ref 39–117)
BUN: 11 mg/dL (ref 6–23)
CO2: 27 mEq/L (ref 19–32)
Calcium: 9.5 mg/dL (ref 8.4–10.5)
Chloride: 104 mEq/L (ref 96–112)
Creatinine, Ser: 0.81 mg/dL (ref 0.40–1.20)
GFR: 76.8 mL/min (ref >60.00)
Glucose, Bld: 93 mg/dL (ref 70–99)
Potassium: 4.5 mEq/L (ref 3.5–5.1)
Sodium: 138 mEq/L (ref 135–145)
Total Bilirubin: 0.5 mg/dL (ref 0.2–1.2)
Total Protein: 6.9 g/dL (ref 6.0–8.3)

## 2018-08-12 LAB — LIPID PANEL
Cholesterol: 230 mg/dL — ABNORMAL HIGH (ref 0–200)
HDL: 57.8 mg/dL (ref >39.00)
LDL Cholesterol: 157 mg/dL — ABNORMAL HIGH (ref 0–99)
NonHDL: 172.15
Total CHOL/HDL Ratio: 4
Triglycerides: 76 mg/dL (ref 0.0–149.0)
VLDL: 15.2 mg/dL (ref 0.0–40.0)

## 2018-11-25 ENCOUNTER — Encounter: Payer: Self-pay | Admitting: Primary Care

## 2018-11-25 ENCOUNTER — Ambulatory Visit (INDEPENDENT_AMBULATORY_CARE_PROVIDER_SITE_OTHER): Payer: BC Managed Care – PPO | Admitting: Primary Care

## 2018-11-25 DIAGNOSIS — I10 Essential (primary) hypertension: Secondary | ICD-10-CM

## 2018-11-25 DIAGNOSIS — G43709 Chronic migraine without aura, not intractable, without status migrainosus: Secondary | ICD-10-CM | POA: Diagnosis not present

## 2018-11-25 MED ORDER — LOSARTAN POTASSIUM 25 MG PO TABS: 25.0000 mg | ORAL_TABLET | Freq: Two times a day (BID) | ORAL | 3 refills | Status: DC

## 2018-11-25 MED ORDER — SUMATRIPTAN SUCCINATE 25 MG PO TABS: ORAL_TABLET | ORAL | 0 refills | Status: AC

## 2018-11-25 NOTE — Patient Instructions (Signed)
Continue taking losartan 25 mg twice daily and metoprolol succinate once daily for blood pressure.  For migraines you can take the sumatriptan (Imitrex) medication. Take 1/2-1 tablet at migraine onset, may repeat in 2 hours if migraine persists. This may cause drowsiness.  Please update me regarding your migraines. Keep monitoring your blood pressure and notify me if you consistently see readings at or above 135/90.  It was a pleasure to see you today! [name], NP-C

## 2018-11-25 NOTE — Assessment & Plan Note (Signed)
Improved with addition of losartan 25 mg BID. Continue losartan and  Metoprolol succinate. She will monitor BP and report consistent reading at or above 135/90.

## 2018-11-25 NOTE — Telephone Encounter (Signed)
This has been addressed today already.

## 2018-11-25 NOTE — Assessment & Plan Note (Signed)
Chronic for years, no prior treatment. Rx for sumatriptan sent to pharmacy. She is to update and if migraines continue to occur twice monthly then we will move forward with daily preventative treatment.

## 2018-11-25 NOTE — Progress Notes (Signed)
Subjective:    Patient ID: Amanda Acosta, female    DOB: November 11, 1975, 43 y.o.   MRN: [id number]  HPI  Virtual Visit via Video Note  I connected with Amanda Acosta on 11/25/18 at 11:20 AM EDT by a video enabled telemedicine application and verified that I am speaking with the correct person using two identifiers.  Location: Patient: Home Provider: Office   I discussed the limitations of evaluation and management by telemedicine and the availability of in person appointments. The patient expressed understanding and agreed to proceed. We connected by the sound would not work. Ultimately the video failed and we had to conduct our visit via phone.  History of Present Illness:  Amanda Acosta is a 43 year old female who presents today for follow up of hypertension. She would also like to discuss chronic headaches.  She was last evaluated on 07/25/18 for follow up and was noted to have elevated office and BP readings. She was compliant to her losartan 25 mg and metoprolol succinate 25 mg. We attempted to increase her losartan to 50 mg in the past but she was unable to tolerate, so we decided to have her take losartan 25 mg BID and continue her metoprolol succinate.   Since her last visit she's checked her BP several times weekly and is getting readings of 110's-130's/70's-80's. She denies dizziness, chest pain.  She endorses what she believes to be occular migraines that began several years ago, occur twice monthly on average. Her migraines are located to the bilateral frontal region which feels like "intense sinus pressure" which will last for an entire day. She will often wake the following day with a "dull headache" that will last for another day. She does have nausea during these episodes. General headaches occur once weekly which are located to the entire head. Her most recent migraine was last week that lasted for 2-3 days.   She will typically take Ibuprofen for general headaches with  improvement. She takes Sudafed, Ibuprofen, antihistamines, and nasal spray for migraines without improvement. She has never been treated for migraines in the past.   Observations/Objective:  Alert and oriented. Appears well, not sickly. No distress. Speaking in complete sentences.   Assessment and Plan:  See problem based charting.  Follow Up Instructions:  Continue taking losartan 25 mg twice daily and metoprolol succinate once daily for blood pressure.  For migraines you can take the sumatriptan (Imitrex) medication. Take 1/2-1 tablet at migraine onset, may repeat in 2 hours if migraine persists. This may cause drowsiness.  Please update me regarding your migraines. Keep monitoring your blood pressure and notify me if you consistently see readings at or above 135/90.  It was a pleasure to see you today! [name], NP-C    I discussed the assessment and treatment plan with the patient. The patient was provided an opportunity to ask questions and all were answered. The patient agreed with the plan and demonstrated an understanding of the instructions.   The patient was advised to call back or seek an in-person evaluation if the symptoms worsen or if the condition fails to improve as anticipated.     Pleas Koch, NP    Review of Systems  Eyes: Negative for photophobia.  Respiratory: Negative for shortness of breath.   Cardiovascular: Negative for chest pain.  Gastrointestinal: Positive for nausea.  Neurological: Positive for headaches. Negative for dizziness.       Past Medical History:  Diagnosis Date  . Arrhythmia   .  Endometriosis   . Heart murmur   . History of genital warts   . History of urinary tract infection   . Hypertension    started with pregnancy, evaluated by cardiology     Social History   Socioeconomic History  . Marital status: Married    Spouse name: Not on file  . Number of children: Not on file  . Years of education: Not on  file  . Highest education level: Not on file  Occupational History  . Not on file  Social Needs  . Financial resource strain: Not on file  . Food insecurity    Worry: Not on file    Inability: Not on file  . Transportation needs    Medical: Not on file    Non-medical: Not on file  Tobacco Use  . Smoking status: Never Smoker  . Smokeless tobacco: Never Used  Substance and Sexual Activity  . Alcohol use: No    Alcohol/week: 0.0 standard drinks  . Drug use: No  . Sexual activity: Yes    Birth control/protection: Surgical  Lifestyle  . Physical activity    Days per week: Not on file    Minutes per session: Not on file  . Stress: Not on file  Relationships  . Social Herbalist on phone: Not on file    Gets together: Not on file    Attends religious service: Not on file    Active member of club or organization: Not on file    Attends meetings of clubs or organizations: Not on file    Relationship status: Not on file  . Intimate partner violence    Fear of current or ex partner: Not on file    Emotionally abused: Not on file    Physically abused: Not on file    Forced sexual activity: Not on file  Other Topics Concern  . Not on file  Social History Narrative   Lives in [place] with husband and children. 1 dog   Work - [employer], Press photographer, works 2 days per week.   Hobbies - spending time with family       Past Surgical History:  Procedure Laterality Date  . OOPHORECTOMY  2014   right, [facility]  . OVARIAN CYST REMOVAL    . TUBAL LIGATION    . VAGINAL DELIVERY     2    Family History  Problem Relation Age of Onset  . Heart disease Paternal Grandmother   . Hypertension Paternal Grandmother   . Heart disease Paternal Grandfather   . Hypertension Paternal Grandfather   . Hypertension Father   . Cancer Father 44       [employer]  . Breast cancer Neg Hx     No Known Allergies  Current Outpatient Medications on File Prior to Visit  Medication Sig  Dispense Refill  . metoprolol succinate (TOPROL-XL) 25 MG 24 hr tablet Take 1 tablet (25 mg total) by mouth daily. For blood pressure. 90 tablet 3  . omeprazole (PRILOSEC) 20 MG capsule Take 20 mg by mouth daily.     No current facility-administered medications on file prior to visit.     LMP 10/30/2016    Objective:   Physical Exam  Constitutional: She is oriented to person, place, and time. She appears well-nourished.  Respiratory: Effort normal.  Neurological: She is alert and oriented to person, place, and time.  Psychiatric: She has a normal mood and affect.  Assessment & Plan:

## 2019-08-16 ENCOUNTER — Other Ambulatory Visit: Payer: Self-pay | Admitting: Primary Care

## 2019-08-16 DIAGNOSIS — I10 Essential (primary) hypertension: Secondary | ICD-10-CM

## 2019-11-14 DIAGNOSIS — I10 Essential (primary) hypertension: Secondary | ICD-10-CM

## 2019-11-16 ENCOUNTER — Other Ambulatory Visit: Payer: Self-pay | Admitting: Primary Care

## 2019-11-16 DIAGNOSIS — I10 Essential (primary) hypertension: Secondary | ICD-10-CM

## 2019-11-16 MED ORDER — METOPROLOL SUCCINATE ER 25 MG PO TB24: ORAL_TABLET | ORAL | 0 refills | Status: DC

## 2019-11-16 MED ORDER — LOSARTAN POTASSIUM 25 MG PO TABS: 25.0000 mg | ORAL_TABLET | Freq: Two times a day (BID) | ORAL | 0 refills | Status: DC

## 2019-11-20 MED ORDER — METOPROLOL SUCCINATE ER 25 MG PO TB24: ORAL_TABLET | ORAL | 0 refills | Status: AC

## 2019-11-22 MED ORDER — LOSARTAN POTASSIUM 25 MG PO TABS: 25.0000 mg | ORAL_TABLET | Freq: Two times a day (BID) | ORAL | 0 refills | Status: AC

## 2019-12-30 ENCOUNTER — Other Ambulatory Visit: Payer: Self-pay | Admitting: Primary Care

## 2019-12-30 DIAGNOSIS — I10 Essential (primary) hypertension: Secondary | ICD-10-CM

## 2020-01-04 NOTE — Telephone Encounter (Signed)
Patient has moved out of town has new PCP. She gets scripts from that office now.

## 2020-06-08 IMAGING — MG DIGITAL SCREENING BILATERAL MAMMOGRAM WITH TOMO AND CAD
8 series · 8 of 24 positions shown · non-contrast
Comparison: None.

CLINICAL DATA: Screening. Baseline.

EXAM:
DIGITAL SCREENING BILATERAL MAMMOGRAM WITH TOMO AND CAD

[L CC synth-2D]
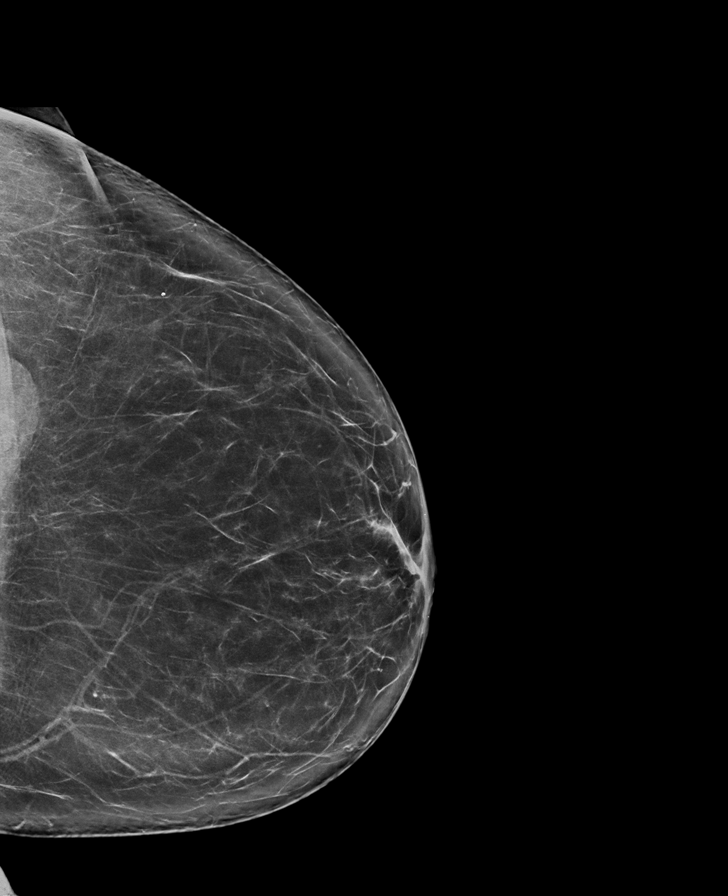

[R CC synth-2D]
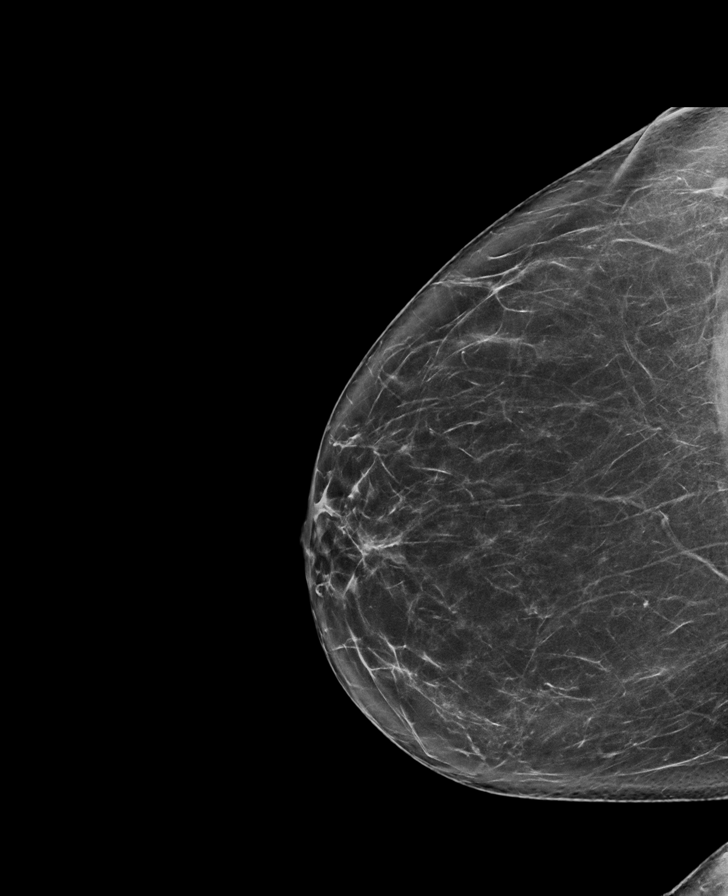

[R MLO synth-2D]
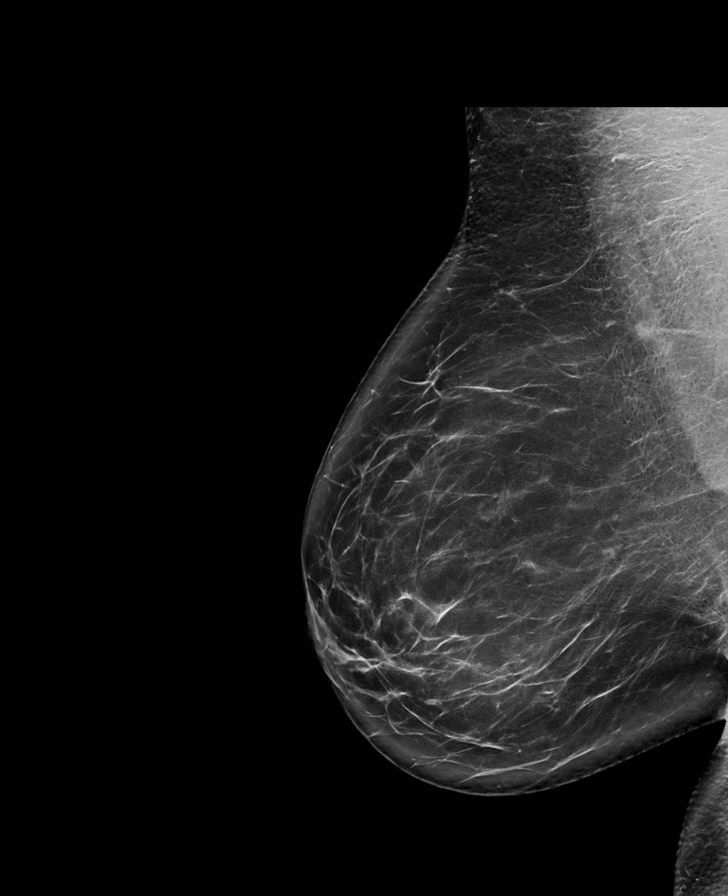

[L MLO synth-2D]
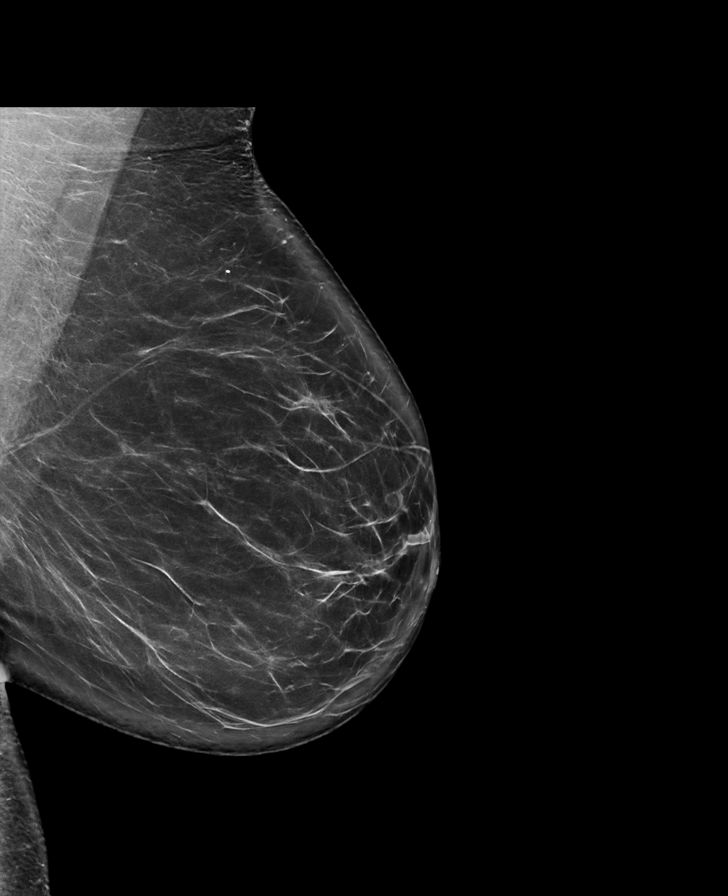

[L CC tomo · tomo slice 42/83.0]
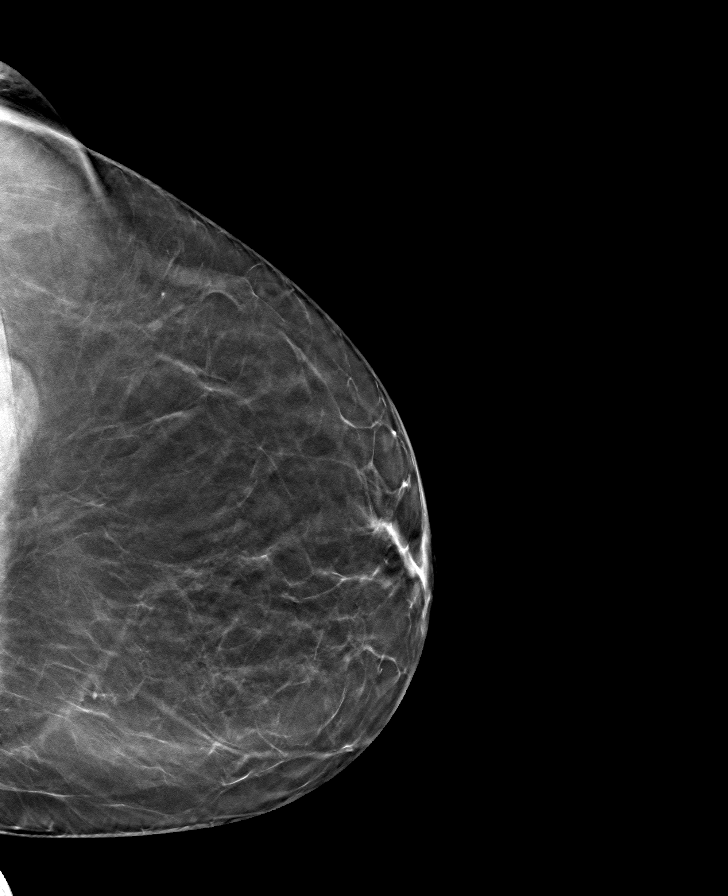

[L MLO tomo · tomo slice 47/92.0]
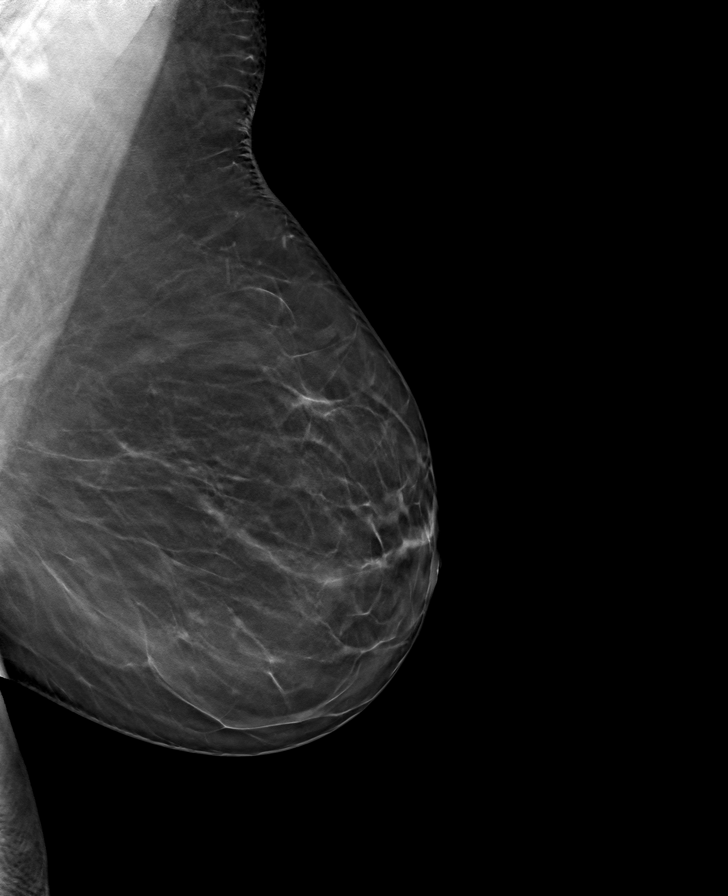

[R MLO tomo · tomo slice 48/95.0]
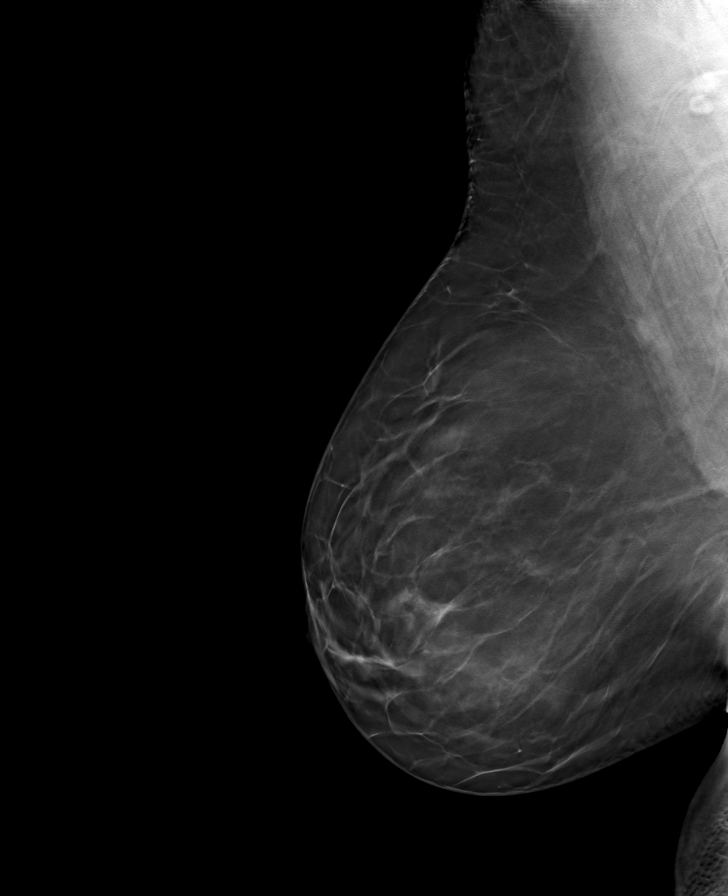

[R CC tomo · tomo slice 39/78.0]
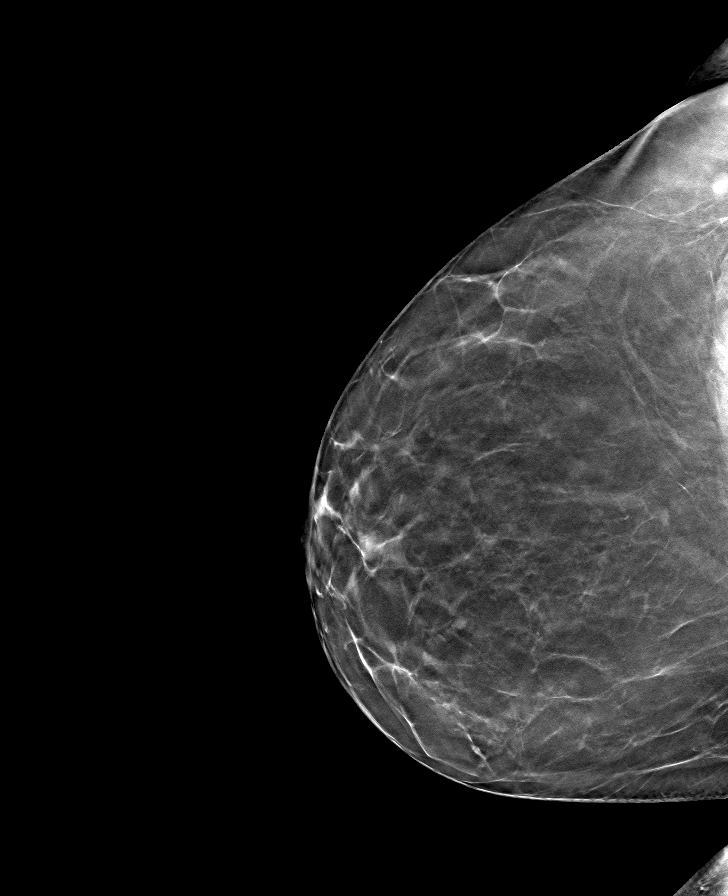

[8 of 24 positions shown; findings below may reference images not displayed]

ACR Breast Density Category b: There are scattered areas of
fibroglandular density.
FINDINGS: There are no findings suspicious for malignancy. Images were
processed with CAD.
IMPRESSION: No mammographic evidence of malignancy. A result letter of this
screening mammogram will be mailed directly to the patient.

RECOMMENDATION:
Screening mammogram in one year. (Code:H9-X-36S)

BI-RADS CATEGORY  1: Negative.
